# Patient Record
Sex: Male | Born: 1950 | Race: White | Hispanic: No | Marital: Married | Smoking: Former smoker
Health system: Southern US, Community
[De-identification: ages and names within clinical notes are randomized; demographics above are authoritative.]

## PROBLEM LIST (undated history)

## (undated) HISTORY — PX: COLONOSCOPY: SHX174

## (undated) HISTORY — PX: BACK SURGERY: SHX140

## (undated) HISTORY — PX: HAND SURGERY: SHX662

---

## 1999-03-12 ENCOUNTER — Encounter: Payer: Self-pay | Admitting: Neurosurgery

## 1999-03-17 ENCOUNTER — Encounter: Payer: Self-pay | Admitting: Neurosurgery

## 1999-03-17 ENCOUNTER — Inpatient Hospital Stay (HOSPITAL_COMMUNITY): Admission: RE | Admit: 1999-03-17 | Discharge: 1999-03-18 | Payer: Self-pay | Admitting: Neurosurgery

## 2003-07-31 ENCOUNTER — Ambulatory Visit (HOSPITAL_COMMUNITY): Admission: RE | Admit: 2003-07-31 | Discharge: 2003-07-31 | Payer: Self-pay | Admitting: *Deleted

## 2003-07-31 ENCOUNTER — Encounter: Payer: Self-pay | Admitting: *Deleted

## 2008-11-27 ENCOUNTER — Ambulatory Visit: Payer: Self-pay | Admitting: Family Medicine

## 2008-11-28 ENCOUNTER — Ambulatory Visit: Payer: Self-pay | Admitting: Family Medicine

## 2010-08-06 ENCOUNTER — Ambulatory Visit: Payer: Self-pay | Admitting: Family Medicine

## 2010-09-02 ENCOUNTER — Ambulatory Visit: Payer: Self-pay | Admitting: Family Medicine

## 2012-05-21 IMAGING — CR DG LUMBAR SPINE 2-3V
1 series · 3 of 3 positions shown · non-contrast
Comparison: none

REASON FOR EXAM: neck pain  back pain
COMMENTS:

[Series 2: view not recorded · 0.17mm/px · 3 of 3 slices shown]
[im 1/3]
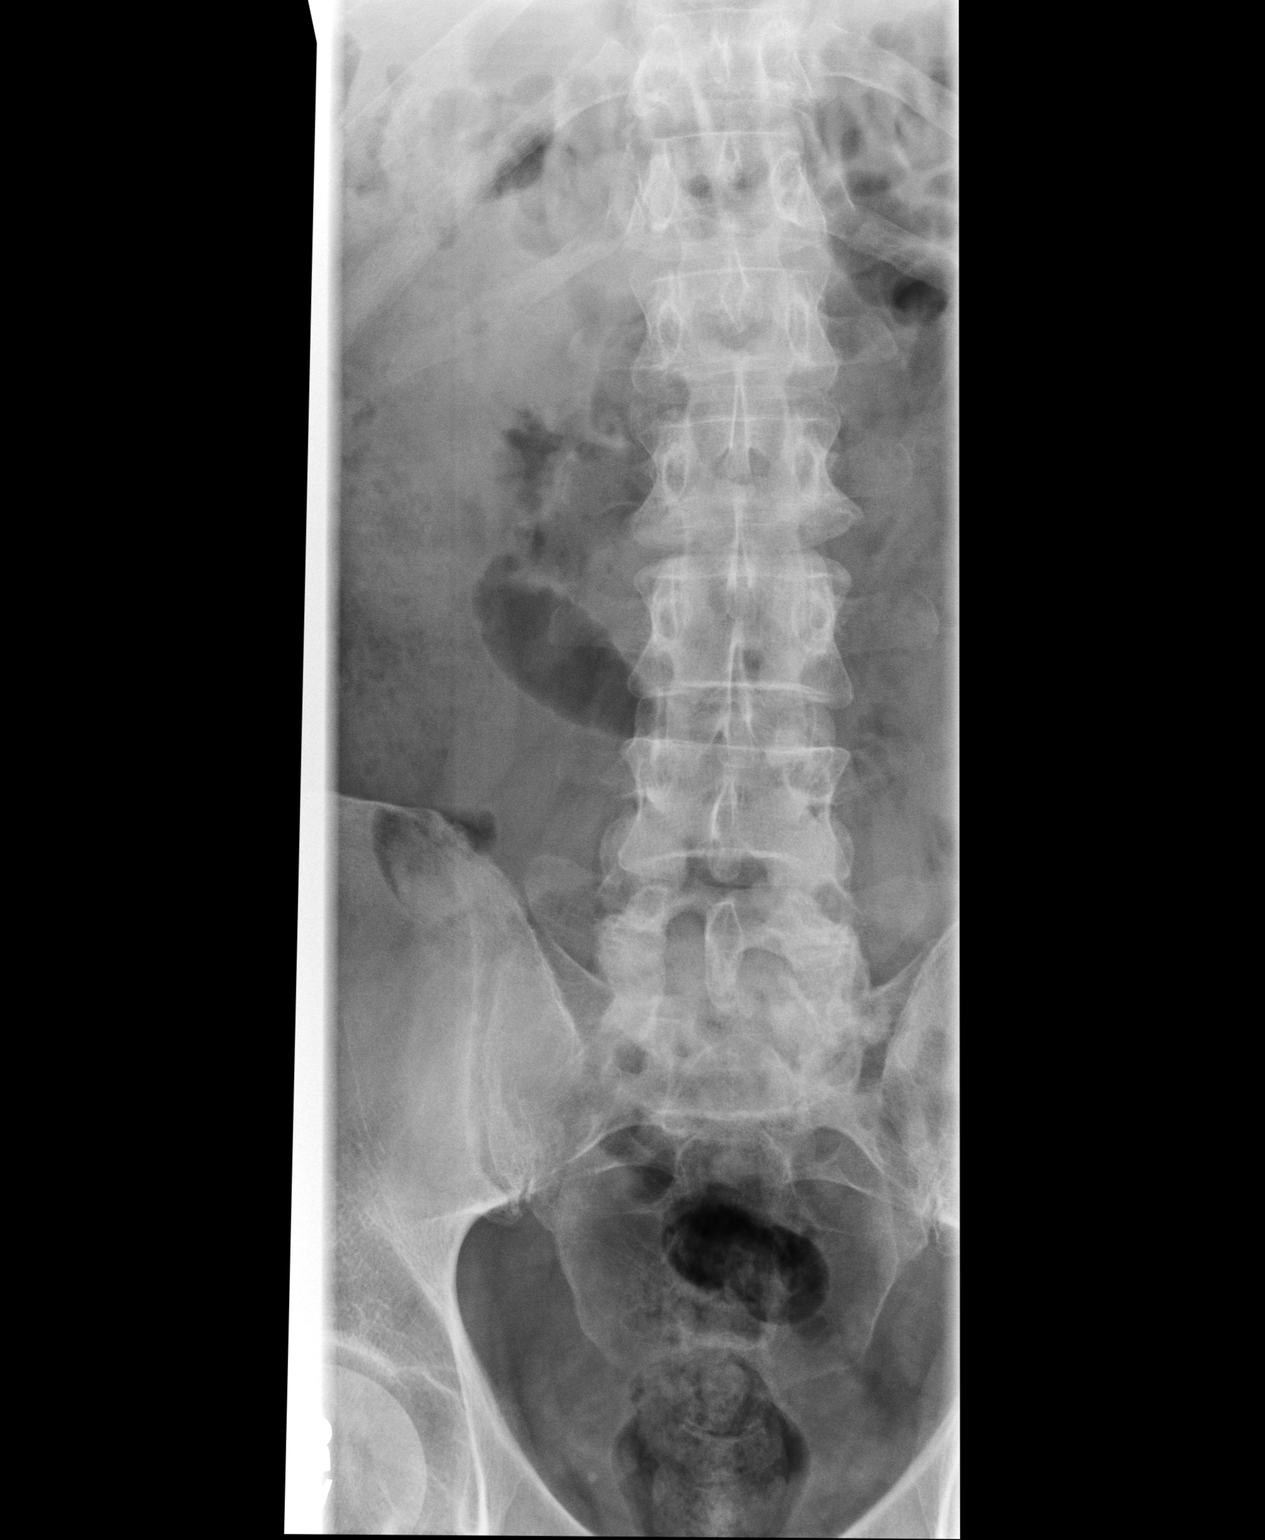
[im 2/3]
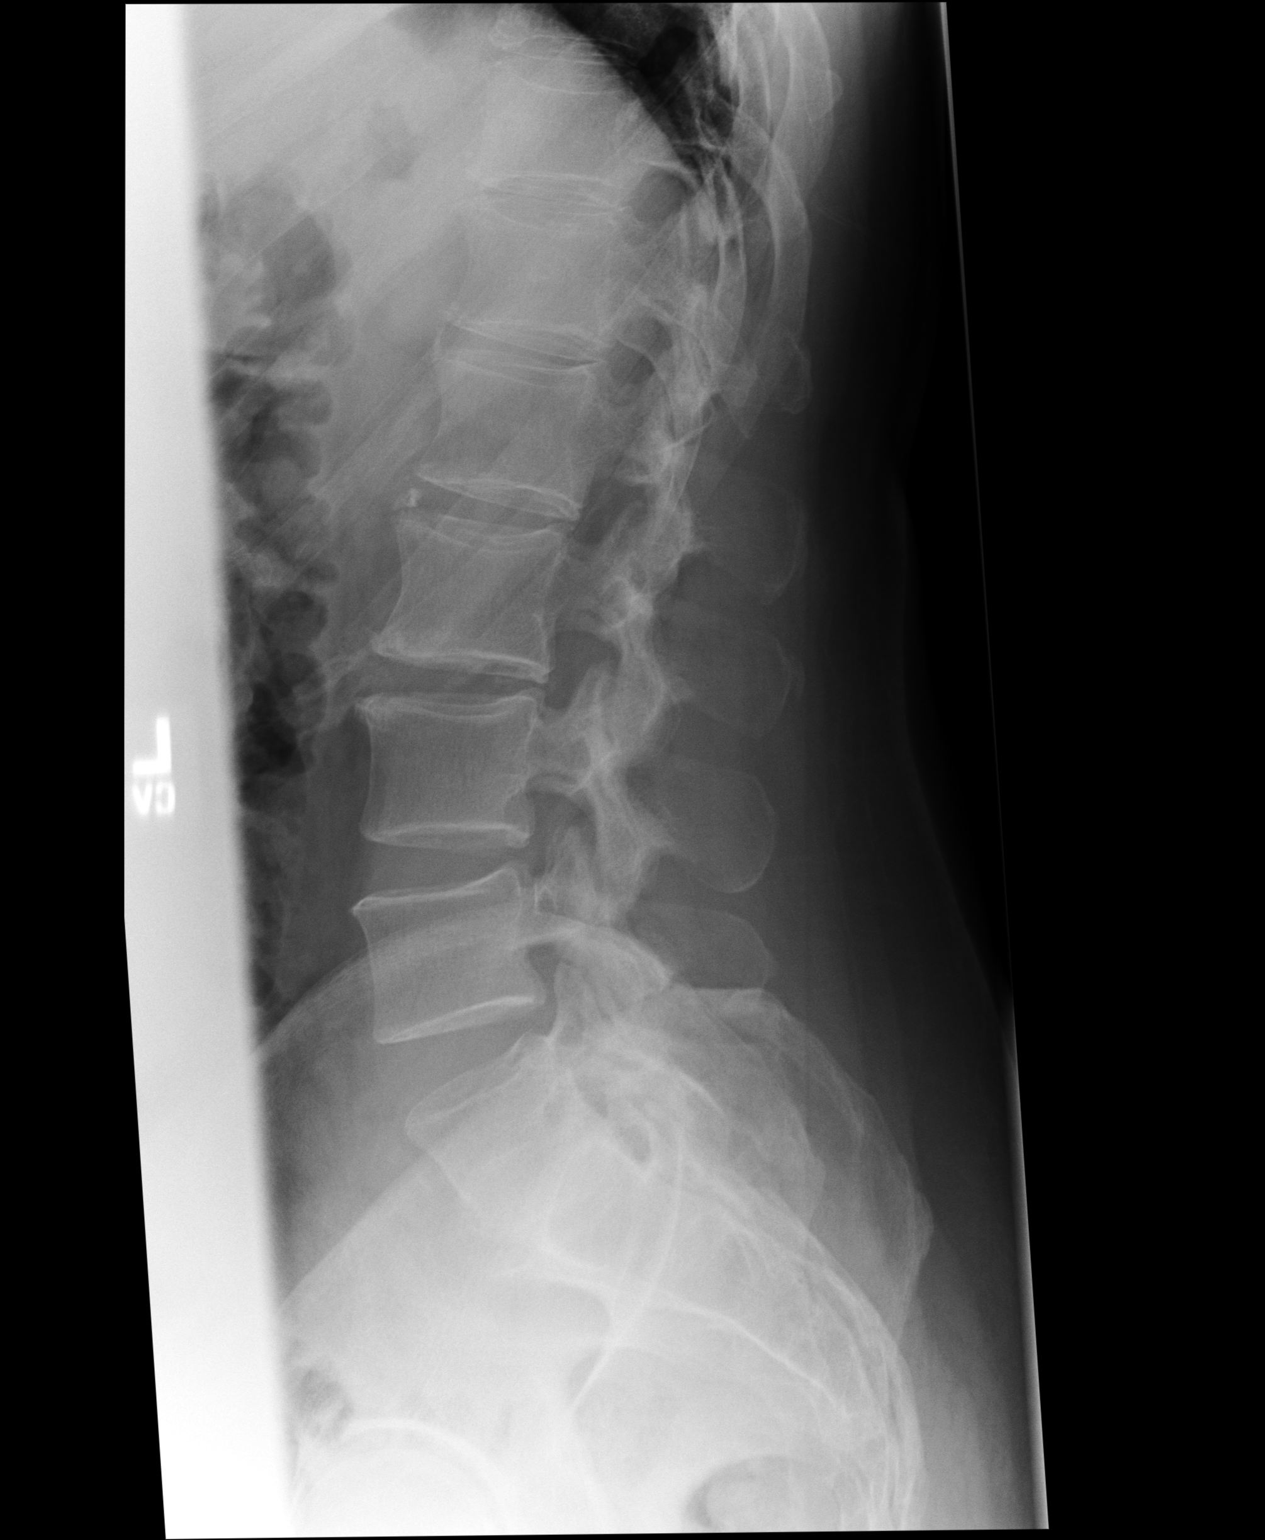
[im 3/3]
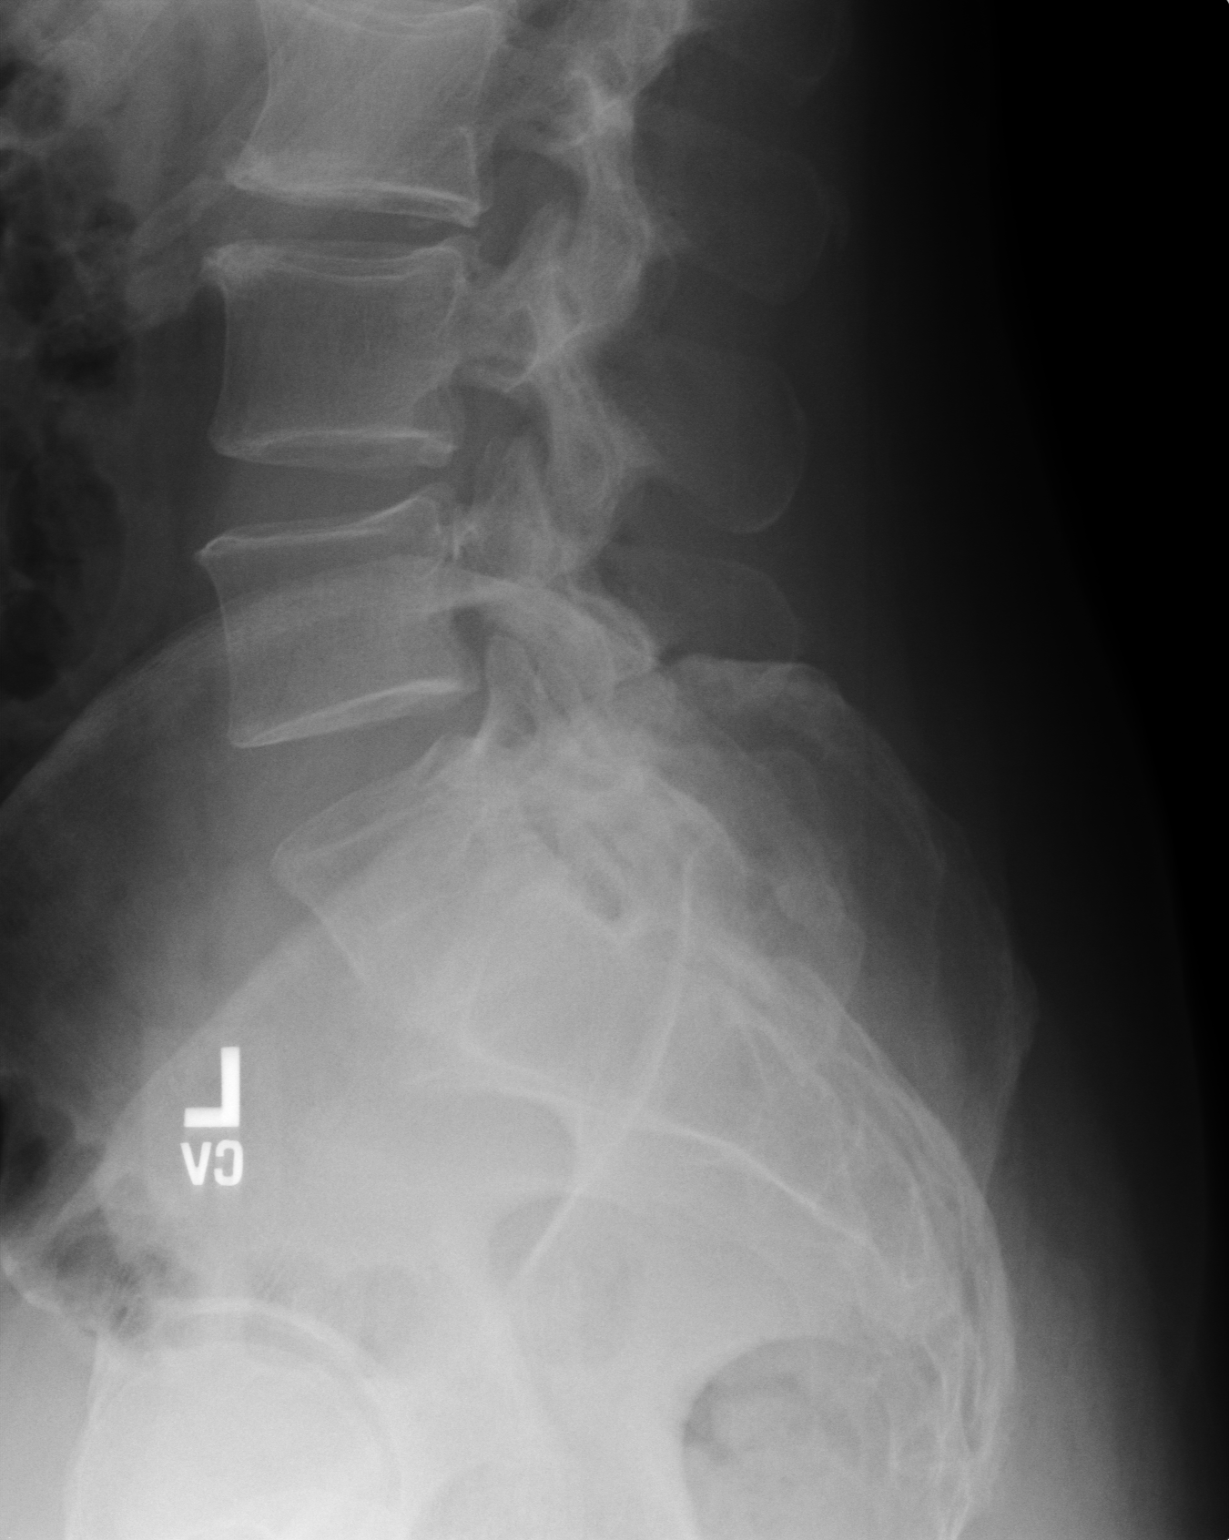

[3 of 3 positions shown; findings below may reference images not displayed]

PROCEDURE:     KDR - KDXR LUMBAR SPINE AP AND LATERAL  - August 06, 2010 [DATE]

RESULT:     The vertebral body heights are well-maintained. Vertebral body
alignment is normal. There is narrowing of the L5-S1 intervertebral disc
space which could either be developmental or secondary to disc disease. Note
is made that changes of disc disease with extrusion of disc material was
noted at this level on prior MR examination of 01/19/1999.

The intervertebral disc spaces otherwise are well maintained. The pedicles
are bilaterally intact.
IMPRESSION: 1. No fracture is seen.
2. There is narrowing with near obliteration of the L5-S1 intervertebral
disc space where changes of disc disease have been previously observed at MR.

## 2012-05-21 IMAGING — CR CERVICAL SPINE - 2-3 VIEW
1 series · 4 of 4 positions shown · non-contrast
Comparison: none

REASON FOR EXAM: neck and back pain
COMMENTS:

[Series 2: view not recorded · 0.17mm/px · 4 of 4 slices shown]
[im 1/4]
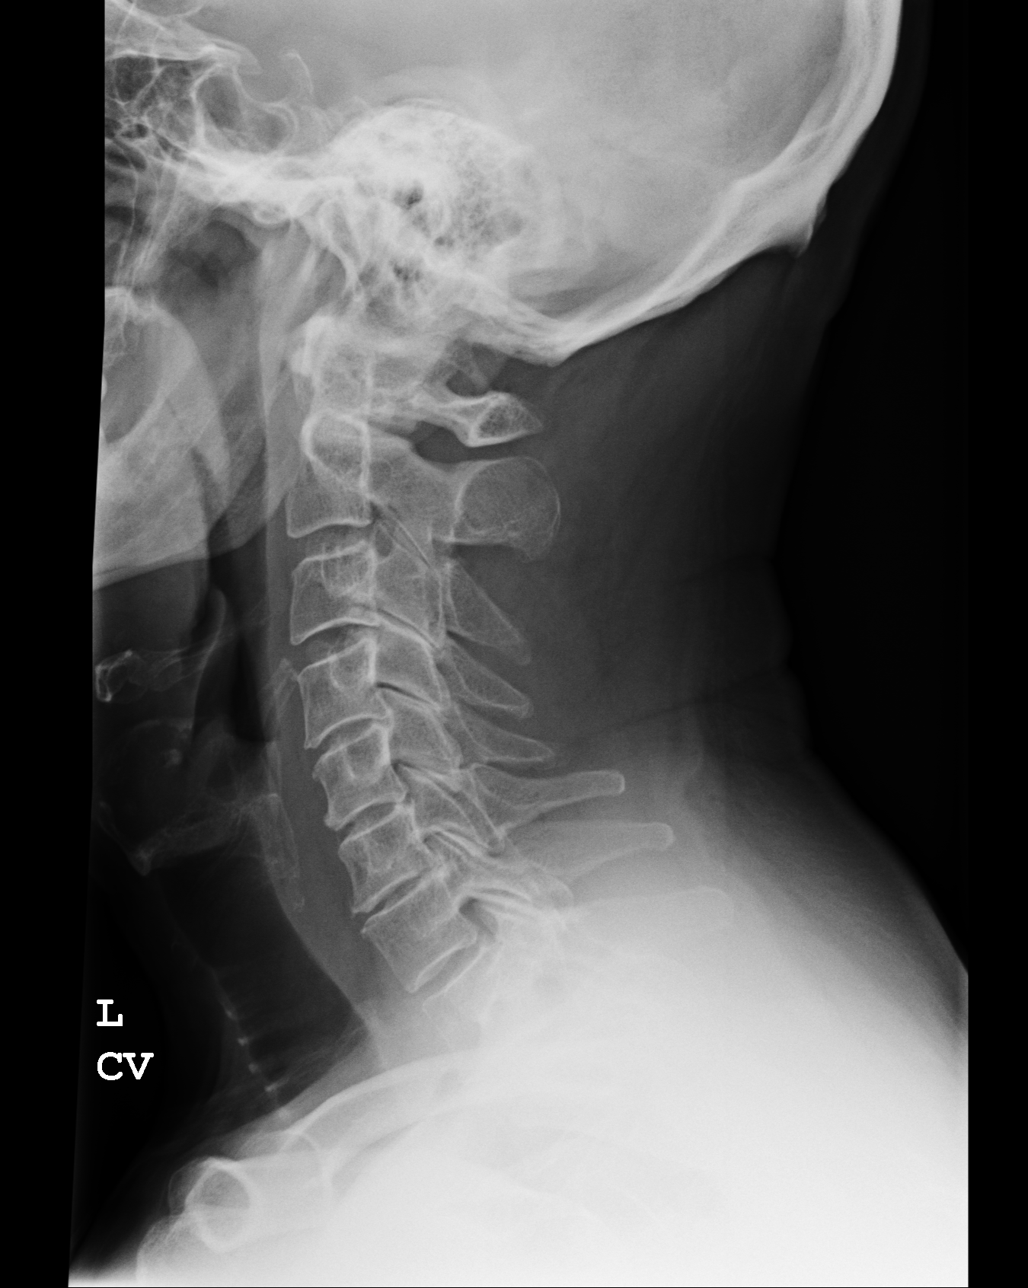
[im 2/4]
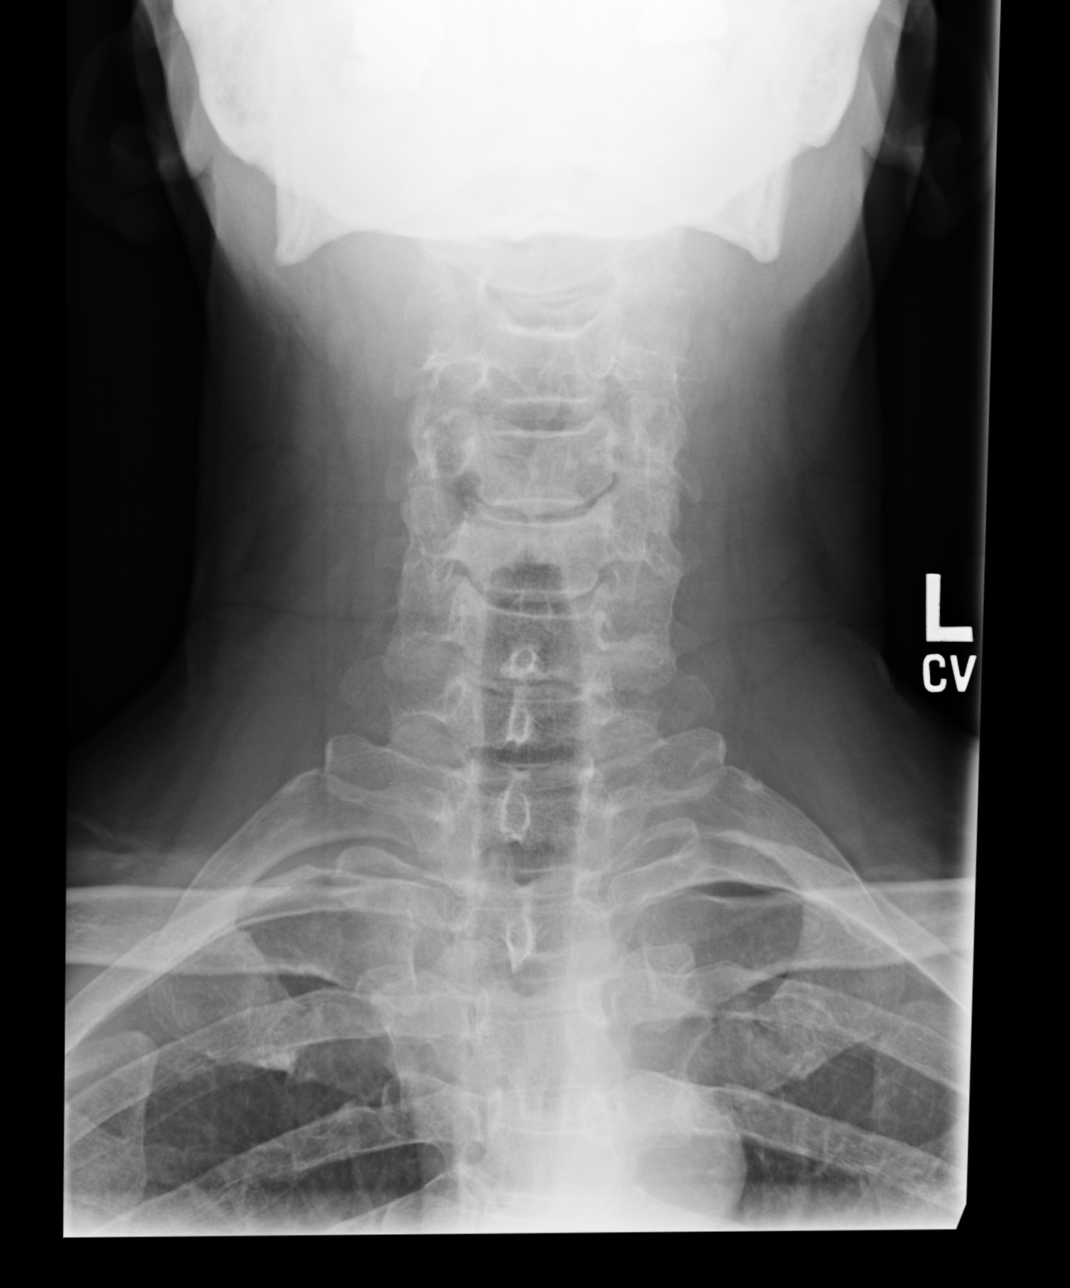
[im 3/4]
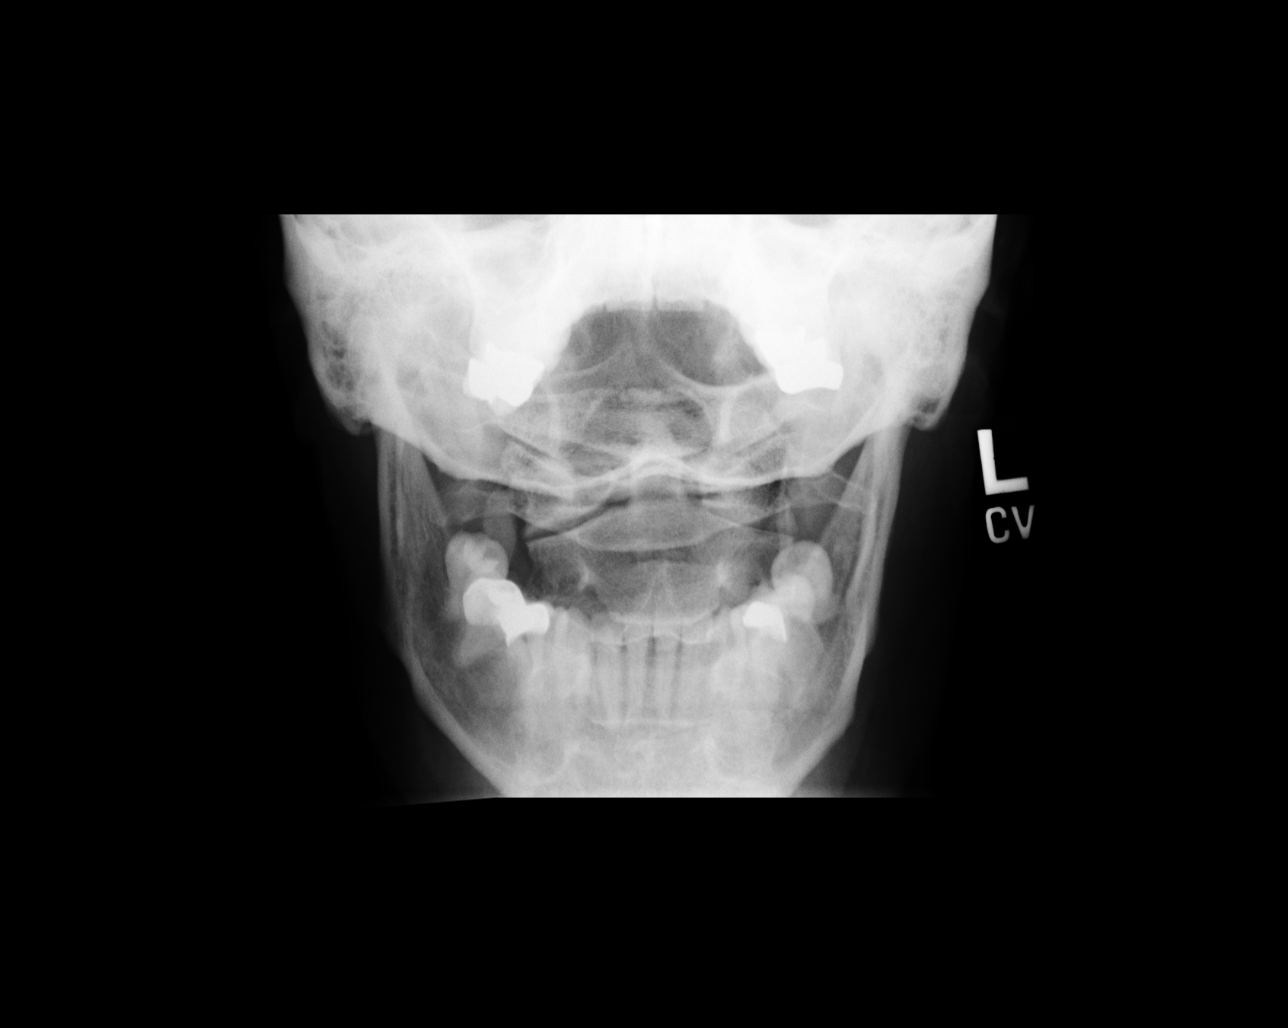
[im 4/4]
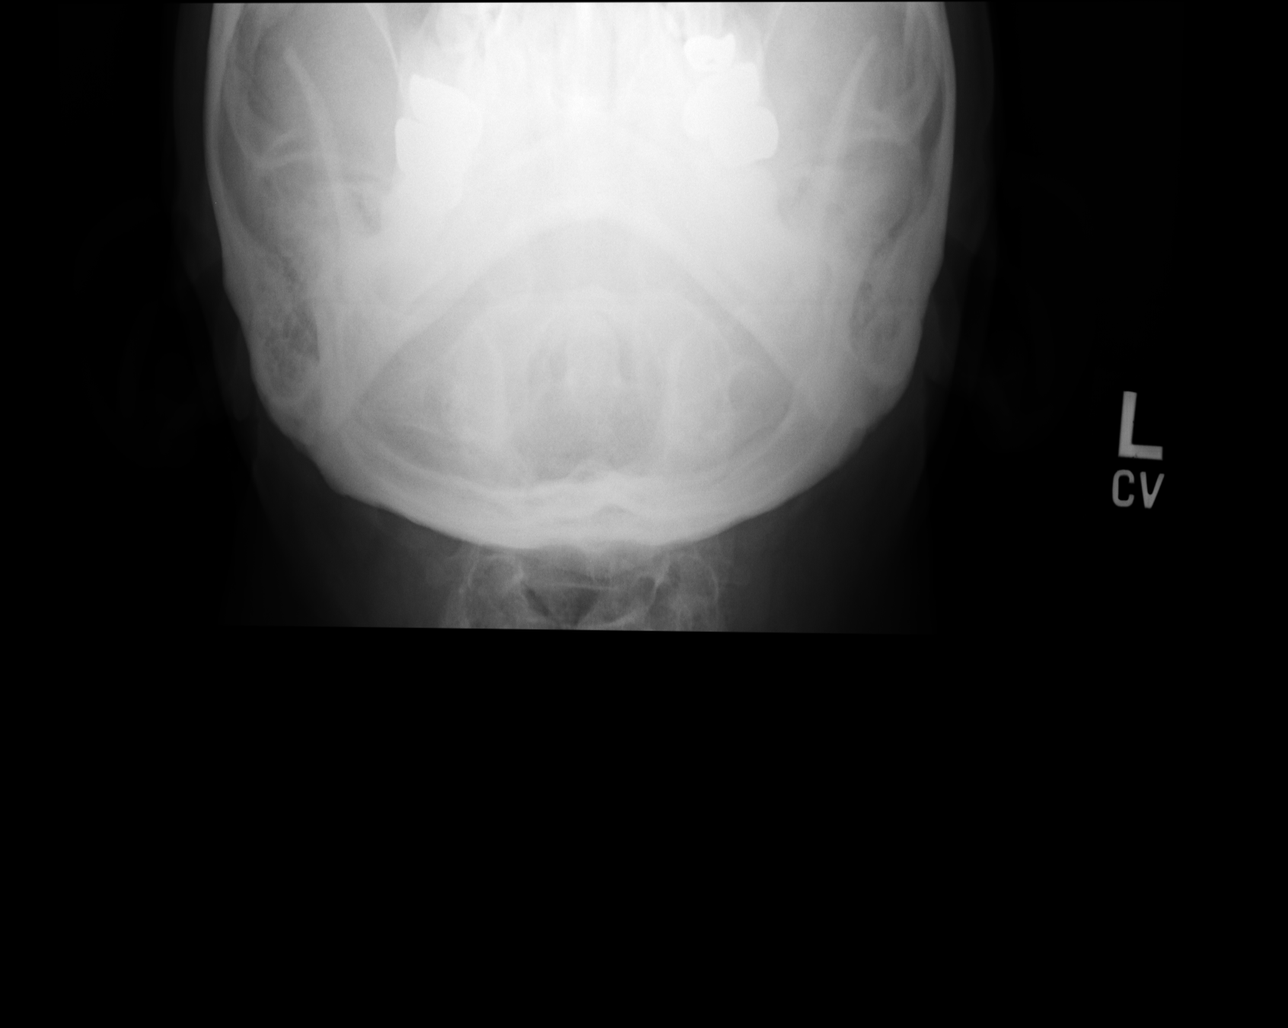

[4 of 4 positions shown; findings below may reference images not displayed]

PROCEDURE:     KDR - KDXR C-SPINE AP AND LATERAL  - August 06, 2010 [DATE]

RESULT:     Comparison is made to a prior exam of 11/27/2008. The vertebral
body heights are well-maintained. There is narrowing of the intervertebral
disc space at C6-C7. There is also noted slight narrowing of the
intervertebral disc spaces at C4-C5 and C5-C6. The possibility of disc
disease at these levels cannot be excluded. This could be further evaluated
by MR if clinically indicated. The odontoid process is intact. No lytic or
blastic lesions are seen.
IMPRESSION: 1. No fracture is seen.
2. There is disc space narrowing at multiple levels suspicious for disc
disease and most prominent at the C6-C7 level. This could be further
evaluated by MR if clinically indicated.

## 2013-12-05 LAB — HM COLONOSCOPY

## 2014-06-02 ENCOUNTER — Observation Stay: Payer: Self-pay | Admitting: Internal Medicine

## 2014-06-02 LAB — BASIC METABOLIC PANEL
Anion Gap: 8 (ref 7–16)
BUN: 19 mg/dL — ABNORMAL HIGH (ref 7–18)
Calcium, Total: 8.6 mg/dL (ref 8.5–10.1)
Chloride: 106 mmol/L (ref 98–107)
Co2: 26 mmol/L (ref 21–32)
Creatinine: 1.06 mg/dL (ref 0.60–1.30)
EGFR (African American): >60
EGFR (Non-African Amer.): >60
Glucose: 123 mg/dL — ABNORMAL HIGH (ref 65–99)
Osmolality: 283 (ref 275–301)
Potassium: 3.5 mmol/L (ref 3.5–5.1)
Sodium: 140 mmol/L (ref 136–145)

## 2014-06-02 LAB — CBC
HCT: 50.1 % (ref 40.0–52.0)
HGB: 16.6 g/dL (ref 13.0–18.0)
MCH: 31.9 pg (ref 26.0–34.0)
MCHC: 33 g/dL (ref 32.0–36.0)
MCV: 97 fL (ref 80–100)
Platelet: 262 10*3/uL (ref 150–440)
RBC: 5.18 10*6/uL (ref 4.40–5.90)
RDW: 14.1 % (ref 11.5–14.5)
WBC: 7.6 10*3/uL (ref 3.8–10.6)

## 2014-07-31 DIAGNOSIS — T782XXA Anaphylactic shock, unspecified, initial encounter: Secondary | ICD-10-CM | POA: Insufficient documentation

## 2015-03-22 NOTE — Discharge Summary (Signed)
PATIENT NAME:  [name], Ryan Castro  DATE OF ADMISSION:  Castro DATE OF DISCHARGE:  Castro  ADMISSION DIAGNOSIS: Anaphylaxis.   DISCHARGE DIAGNOSES:  1. Anaphylaxis, unclear etiology.  2. History of hyperlipidemia.   CONSULTATIONS: None.   HOSPITAL COURSE: A 64 year old male who presented with itching and angioedema. For further details, please refer to H and P.  1. Anaphylaxis. Unclear etiology as the patient had no new exposures, no new medications. He is taking aspirin daily, which sometimes can cause anaphylaxis, so we stopped this. He was on IV steroids and will be transitioned to p.o. steroids Benadryl and ranitidine.  He will need to see an allergist on Monday. This was discussed with the patient as well as his wife. He will be discharged with EpiPen.  2. Hyperlipidemia. The patient will continue Lipitor.   3. GERD. The patient is on a PPI.   DISCHARGE MEDICATIONS:  1. Prednisone taper starting at 50 mg taper by 10 mg every 2 days.  2. Benadryl 25 mg q. 4 hours p.r.n.  3. Ranitidine 150 b.i.d. x 5 days.  4. EpiPen. 5. Lipitor daily.  6. Reflux medication daily.   DISCHARGE DIET: Low fat, low sodium, low cholesterol diet.   DISCHARGE ACTIVITY: As tolerated.   DISCHARGE FOLLOWUP: Patient will follow up with Dr. Sullivan LoneGilbert in 1 week and the allergist tomorrow.   TIME SPENT: 35 minutes.   The patient is stable for discharge.    ____________________________ Maisen Schmit P. Juliene PinaMody, MD spm:dd D: Castro 11:06:50 ET T: Castro 19:12:30 ET JOB#: [id number]  cc: Dorthy Hustead P. Juliene PinaMody, MD, <Dictator> Richard L. Sullivan LoneGilbert, MD Janyth ContesSITAL P Saharsh Sterling MD ELECTRONICALLY SIGNED 06/10/2014 10:55

## 2015-03-22 NOTE — H&P (Signed)
PATIENT NAME:  Ryan Castro, Ryan Castro MR#:  [id number] DATE OF BIRTH:  [birth year]  DATE OF ADMISSION:  06/02/2014.  PRIMARY CARE PHYSICIAN:  Richard L. Sullivan LoneGilbert, MD.  REFERRING PHYSICIAN:  Dr. Dolores FrameSung.   CHIEF COMPLAINT: Anaphylactic reaction.   HISTORY OF PRESENT ILLNESS: The patient is a 64 year old Caucasian male with past medical history of GERD, hyperlipidemia and chronic low back pain who is presenting to the ED with chief complaint of allergic reaction. The patient woke up this morning at around 3:30 a.m. with itching and noticed his tongue was swollen. He felt nauseated and started noticing hives all over his body including legs, chest, abdomen and arms. This was associated with diaphoresis and dizziness. He denies passing out. He felt tight in his chest. The patient immediately came into the ED.  Initially his blood pressure was low as reported by the Emergency Room nurse. The patient was immediately given EpiPen subcutaneously, Benadryl, IV Solu-Medrol and ranitidine.  Subsequently, his blood pressure was back to normal, dizziness was significantly improved, hives were resolved but the patient was still complaining of the sensation of his throat closing and swollen tongue. He is having some difficulty with swallowing. He received another 10 mg of Decadron IV by the ED physician. Hospitalist team was called to admit the patient for close monitoring.   During my examination the patient is reporting that his chest and hives are resolved but he is still feeling uncomfortable and has a closing sensation in his throat. He is reporting that his tongue is still swollen though swelling is minimal. Wife is at bedside. No similar complaints in the past. He denies taking any unusual foods, new antibiotics or drugs. No similar complaints in the past. He not using any ACE inhibitors or angiotensin receptor blockers. No other complaints.   PAST MEDICAL HISTORY: Chronic low back pain, hyperlipidemia, GERD.   PAST  SURGICAL HISTORY: None.   ALLERGIES: No known drug allergies.   PSYCHOSOCIAL HISTORY: Lives at home with wife. Denies any history of smoking. Occasional intake of alcohol. Denies any illicit drug usage.   FAMILY HISTORY: Dad deceased with cancer,   HOME MEDICATIONS: Reflux medication, Lipitor dose unknown, aspirin 81 mg once daily.   REVIEW OF SYSTEMS:  CONSTITUTIONAL: Denies any fever or fatigue. No weight loss or weight gain.  EYES: Denies blurry vision, double or glaucoma.  ENT: Denies epistaxis or discharge or tinnitus.  RESPIRATORY: Denies any cough or COPD, complaining of shortness of breath, throat closing sensation, tongue swelling.  CARDIAC:  Denies any heart attacks in the past. No history of congestive heart failure. No peripheral edema.  GASTROINTESTINAL:  Complaining of nausea. Denies any vomiting. No diarrhea. Denies any abdominal pain. No hematemesis or melena.  NEUROLOGIC: Denies any vertigo, ataxia, but felt dizzy, no history of stroke or TIA.  SKIN: With hives all over his body, but that had resolved during my examination; no other rashes or lesions.  MUSCULOSKELETAL: No joint pain in the neck, has chronic low back pain, denies any gout.  ENDOCRINE: Denies any polyuria, nocturia, heat or cold intolerance.  PSYCHIATRIC: Normal mood and affect.   PHYSICAL EXAMINATION: VITAL SIGNS: Temperature 97.5, pulse 66, respirations 18, blood pressure is 130/88, pulse oximetry is 97%.  GENERAL APPEARANCE: Not in acute distress. Moderately built and nourished.  HEENT: Normocephalic, atraumatic. Pupils are equally reacting to light and accommodation. No scleral icterus. No conjunctival injection. No sinus tenderness. No postnasal drip. Moist mucous membranes. Thickening of tongue with angioedema, uvula is midline. No tonsillomegaly.  NECK: Supple. No JVD. No thyromegaly. Range of motion is intact.  LUNGS: Clear to auscultation bilaterally. No accessory muscle use and no wheezing. No  anterior chest wall tenderness on palpation.  CARDIAC: S1 and S2 normal. Regular rate and rhythm. No murmurs.  GASTROINTESTINAL: Soft. Bowel sounds are positive in all 4 quadrants. Nontender, nondistended. No hepatosplenomegaly. No masses.  NEUROLOGICAL:  Alert and oriented x3. Cranial nerves II-XII are grossly intact. Motor and sensory are intact. Reflexes are 2+.   EXTREMITIES: No edema or cyanosis. No clubbing.  SKIN: Warm to touch. Normal turgor. No rashes. No lesions.  MUSCULOSKELETAL: No joint effusion, tenderness, erythema.  PSYCHIATRIC: Normal mood and affect.    LABORATORY AND IMAGING STUDIES: A 12-lead EKG, normal sinus rhythm at 67 beats per minute. Nonspecific ST-T wave changes.   CBC is normal. Chem-8: BUN 19, glucose 123. The rest of the labs are normal.   ASSESSMENT: A 64 year old Caucasian male presenting to the ED with a chief complaint of hives, chest tightness, throat closing, angioedema associated with itching and dizziness, associated with diaphoresis.   PLAN:  The patient will be admitted with the following assessment and plan:  1.  Anaphylactic reaction with unknown source. The patient has received subcutaneous EpiPen, Solu-Medrol IV, Benadryl IV and ranitidine in the Emergency Department. We will continue Solu-Medrol IV, nebulizer treatments and Benadryl as needed. We will continue close monitoring of the patient, source is unclear at this time.  2.  Gastroesophageal reflux disease. We will provide pepcid 3.  Chronic low back pain. Pain management on an as-needed basis.  4.  History of hyperlipidemia, dose of Lipitor is unknown.  Will renew medications after verification of the dosing.  5.  Dizziness, probably from problem number 1, which is resolved now. We will continue close monitoring on telemetry.   Plan of care discussed with the patient.   CODE STATUS:  Full code.   The patient's wife is the medical power of attorney.   Total Time Spent on admission: 45  minutes.    ____________________________ Ramonita Lab, MD ag:lt D: 06/02/2014 07:41:08 ET T: 06/02/2014 08:11:25 ET JOB#: [id number]  cc: Ramonita Lab, MD, <Dictator> Richard L. Sullivan Lone, MD Ramonita Lab MD ELECTRONICALLY SIGNED 06/04/2014 0:38

## 2015-03-25 LAB — CBC AND DIFFERENTIAL
HCT: 46 % (ref 41–53)
Hemoglobin: 16.1 g/dL (ref 13.5–17.5)
NEUTROS ABS: 2 /uL
Platelets: 234 10*3/uL (ref 150–399)
WBC: 4.7 10^3/mL

## 2015-03-25 LAB — BASIC METABOLIC PANEL
BUN: 20 mg/dL (ref 4–21)
Creatinine: 0.9 mg/dL (ref 0.6–1.3)
GLUCOSE: 84 mg/dL
POTASSIUM: 4.9 mmol/L (ref 3.4–5.3)
SODIUM: 142 mmol/L (ref 137–147)

## 2015-03-25 LAB — HEPATIC FUNCTION PANEL
ALT: 31 U/L (ref 10–40)
AST: 33 U/L (ref 14–40)
Alkaline Phosphatase: 61 U/L (ref 25–125)
Bilirubin, Total: 0.7 mg/dL

## 2015-03-25 LAB — LIPID PANEL
Cholesterol: 176 mg/dL (ref 0–200)
HDL: 76 mg/dL — AB (ref 35–70)
LDL Cholesterol: 81 mg/dL
LDl/HDL Ratio: 1.1
Triglycerides: 97 mg/dL (ref 40–160)

## 2015-03-25 LAB — TSH: TSH: 2.81 u[IU]/mL (ref 0.41–5.90)

## 2015-03-25 LAB — PSA: PSA: 0.5

## 2015-08-26 LAB — HEPATIC FUNCTION PANEL
ALK PHOS: 59 U/L (ref 25–125)
ALT: 30 U/L (ref 10–40)
AST: 32 U/L (ref 14–40)
Bilirubin, Total: 0.8 mg/dL

## 2015-08-26 LAB — CBC AND DIFFERENTIAL
HEMATOCRIT: 49 % (ref 41–53)
HEMOGLOBIN: 16.3 g/dL (ref 13.5–17.5)
Platelets: 251 10*3/uL (ref 150–399)
WBC: 4.8 10*3/mL

## 2015-08-26 LAB — LIPID PANEL
CHOLESTEROL: 164 mg/dL (ref 0–200)
HDL: 61 mg/dL (ref 35–70)
LDL Cholesterol: 78 mg/dL
LDL/HDL RATIO: 2.7
TRIGLYCERIDES: 123 mg/dL (ref 40–160)

## 2015-08-26 LAB — BASIC METABOLIC PANEL
BUN: 19 mg/dL (ref 4–21)
Creatinine: 1 mg/dL (ref 0.6–1.3)
Glucose: 73 mg/dL
POTASSIUM: 4.8 mmol/L (ref 3.4–5.3)
Sodium: 142 mmol/L (ref 137–147)

## 2015-08-26 LAB — PSA: PSA: 0.5

## 2015-11-21 ENCOUNTER — Encounter: Payer: Self-pay | Admitting: Family Medicine

## 2015-12-18 DIAGNOSIS — M5414 Radiculopathy, thoracic region: Secondary | ICD-10-CM | POA: Insufficient documentation

## 2015-12-18 DIAGNOSIS — M5412 Radiculopathy, cervical region: Secondary | ICD-10-CM | POA: Insufficient documentation

## 2015-12-18 DIAGNOSIS — M720 Palmar fascial fibromatosis [Dupuytren]: Secondary | ICD-10-CM | POA: Insufficient documentation

## 2015-12-18 DIAGNOSIS — E7800 Pure hypercholesterolemia, unspecified: Secondary | ICD-10-CM | POA: Insufficient documentation

## 2015-12-18 DIAGNOSIS — K2100 Gastro-esophageal reflux disease with esophagitis, without bleeding: Secondary | ICD-10-CM | POA: Insufficient documentation

## 2015-12-18 DIAGNOSIS — E663 Overweight: Secondary | ICD-10-CM | POA: Insufficient documentation

## 2015-12-18 DIAGNOSIS — F419 Anxiety disorder, unspecified: Secondary | ICD-10-CM | POA: Insufficient documentation

## 2015-12-18 DIAGNOSIS — M109 Gout, unspecified: Secondary | ICD-10-CM | POA: Insufficient documentation

## 2016-03-15 ENCOUNTER — Encounter: Payer: Self-pay | Admitting: Family Medicine

## 2016-03-15 ENCOUNTER — Ambulatory Visit (INDEPENDENT_AMBULATORY_CARE_PROVIDER_SITE_OTHER): Payer: BLUE CROSS/BLUE SHIELD | Admitting: Family Medicine

## 2016-03-15 VITALS — BP 116/84 | HR 78 | Temp 98.0°F | Resp 14 | Ht 68.75 in | Wt 202.0 lb

## 2016-03-15 DIAGNOSIS — R202 Paresthesia of skin: Secondary | ICD-10-CM

## 2016-03-15 DIAGNOSIS — Z1211 Encounter for screening for malignant neoplasm of colon: Secondary | ICD-10-CM

## 2016-03-15 DIAGNOSIS — Z0000 Encounter for general adult medical examination without abnormal findings: Secondary | ICD-10-CM

## 2016-03-15 DIAGNOSIS — R200 Anesthesia of skin: Secondary | ICD-10-CM | POA: Diagnosis not present

## 2016-03-15 DIAGNOSIS — Z125 Encounter for screening for malignant neoplasm of prostate: Secondary | ICD-10-CM

## 2016-03-15 LAB — IFOBT (OCCULT BLOOD): IMMUNOLOGICAL FECAL OCCULT BLOOD TEST: NEGATIVE

## 2016-03-15 NOTE — Progress Notes (Deleted)
Patient ID: Ryan RossettiStephen Castro, male   DOB: [birth year], 65 y.o.   MRN: [id number]  Visit Date: 03/15/2016  Today's Provider: Megan Mansichard Gilbert Jr, MD   Chief Complaint  Patient presents with  . Annual Exam   Subjective:  Ryan RossettiStephen Castro is a 65 y.o. male who presents today for health maintenance and complete physical. He feels {DESC; WELL/FAIRLY WELL/POORLY:18703}. He reports exercising ***. He reports he is sleeping {DESC; WELL/FAIRLY WELL/POORLY:18703}. Immunization History  Administered Date(s) Administered  . Tdap 02/21/2008  . Zoster 11/22/2011   Last  Colonoscopy 11/04/03 repeat in 10 years-diverticulosis, internal hemorrhoids, gastritis.  Review of Systems  Social History   Social History  . Marital Status: Married    Spouse Name: N/A  . Number of Children: N/A  . Years of Education: N/A   Occupational History  . Not on file.   Social History Main Topics  . Smoking status: Not on file  . Smokeless tobacco: Not on file  . Alcohol Use: Not on file  . Drug Use: Not on file  . Sexual Activity: Not on file   Other Topics Concern  . Not on file   Social History Narrative  . No narrative on file    Patient Active Problem List   Diagnosis Date Noted  . Anxiety 12/18/2015  . 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 Esophagitis, reflux 12/18/2015  . Gout 12/18/2015  . Hypercholesterolemia 12/18/2015  . Overweight 12/18/2015  . 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 Back surgery      due to ruptured disk at L4 and L5 level  . Hand surgery      left, growth on tendon    His family history includes Breast cancer in his mother; Diabetes in his brother and sister; Fibromyalgia in his sister; Gout in his father; Lung cancer in his father; Renal cancer in his father; Thyroid cancer in his father.    Outpatient Prescriptions Prior to Visit  Medication Sig Dispense Refill  . aspirin 81 MG tablet  Take by mouth.    Marland Kitchen. atorvastatin (LIPITOR) 20 MG tablet Take by mouth.    Marland Kitchen. omeprazole (PRILOSEC) 40 MG capsule Take by mouth.    . Phosphatidylserine-DHA-EPA (VAYACOG) 100-19.5-6.5 MG CAPS Take by mouth.    . Phosphatidylserine-DHA-EPA (VAYARIN) 75-21.5-8.5 MG CAPS Take by mouth.    . vitamin C (ASCORBIC ACID) 500 MG tablet Take by mouth.     No facility-administered medications prior to visit.    Patient Care Team: Maple Hudsonichard L Gilbert Jr., MD as PCP - General (Family Medicine)     Objective:   Vitals: There were no vitals filed for this visit.  Physical Exam   Depression Screen No flowsheet data found.    Assessment & Plan:

## 2016-03-15 NOTE — Progress Notes (Signed)
Patient ID: Ryan Castro, male   DOB: 1951-06-03, 65 y.o.   MRN: [id number]  Visit Date: 03/15/2016  Today's Provider: Megan Mansichard Shadaya Marschner Jr, MD   Chief Complaint  Patient presents with  . Medicare Wellness   Subjective:   Ryan Castro is a 65 y.o. male who presents today for his CPE  He feels well. He reports exercising daily-walking. He reports he is sleeping well. Immunization History  Administered Date(s) Administered  . Tdap 02/21/2008  . Zoster 11/22/2011   Last  Colonoscopy 11/04/03 repeat in 10 years-diverticulosis, internal hemorrhoids, gastritis. Patient states he had another colonoscopy in 2014 around there with Dr. Cherene AltesElliott-will request those records.  Review of Systems  Constitutional: Negative.   HENT: Negative.   Eyes: Negative.   Respiratory: Negative.   Cardiovascular: Negative.   Gastrointestinal: Negative.   Endocrine: Negative.   Genitourinary: Negative.   Musculoskeletal: Negative.   Skin: Negative.   Allergic/Immunologic: Negative.   Neurological: Negative.   Hematological: Negative.   Psychiatric/Behavioral: Negative.     Patient Active Problem List   Diagnosis Date Noted  . Anxiety 12/18/2015  . 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 Esophagitis, reflux 12/18/2015  . Gout 12/18/2015  . Hypercholesterolemia 12/18/2015  . Overweight 12/18/2015  . 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 Allergic shock 07/31/2014    Social History   Social History  . Marital Status: Married    Spouse Name: N/A  . Number of Children: N/A  . Years of Education: N/A   Occupational History  . Not on file.   Social History Main Topics  . Smoking status: Former Smoker -- 0.50 packs/day for 7 years    Types: Cigarettes    Quit date: 11/29/1976  . Smokeless tobacco: Never Used  . Alcohol Use: Yes     Comment: 2 drinks daily  . Drug Use: No  . Sexual Activity: Not on file   Other Topics Concern  . Not on file   Social History  Narrative    Past Surgical History  Procedure Laterality Date  . Back surgery      due to ruptured disk at L4 and L5 level  . Hand surgery      left, growth on tendon    His family history includes Breast cancer in his mother; Diabetes in his brother and sister; Fibromyalgia in his sister; Gout in his father; Lung cancer in his father; Renal cancer in his father; Thyroid cancer in his father.    Outpatient Prescriptions Prior to Visit  Medication Sig Dispense Refill  . aspirin 81 MG tablet Take by mouth.    Marland Kitchen. atorvastatin (LIPITOR) 20 MG tablet Take 20 mg by mouth. Takes 1/2 tablet once daily    . vitamin C (ASCORBIC ACID) 500 MG tablet Take by mouth.    Marland Kitchen. omeprazole (PRILOSEC) 40 MG capsule Take by mouth.    . Phosphatidylserine-DHA-EPA (VAYACOG) 100-19.5-6.5 MG CAPS Take by mouth.    . Phosphatidylserine-DHA-EPA (VAYARIN) 75-21.5-8.5 MG CAPS Take by mouth.     No facility-administered medications prior to visit.    No Known Allergies  Patient Care Team: Maple Hudsonichard L Bunnie Lederman Jr., MD as PCP - General (Family Medicine)  Objective:   Vitals:  Filed Vitals:   03/15/16 0910  BP: 116/84  Pulse: 78  Temp: 98 F (36.7 C)  Resp: 14  Height: 5' 8.75" (1.746 m)  Weight: 202 lb (91.627 kg)    Physical Exam  Constitutional: He is oriented to person,  place, and time. He appears well-developed and well-nourished.  HENT:  Head: Normocephalic and atraumatic.  Right Ear: External ear normal.  Left Ear: External ear normal.  Mouth/Throat: Oropharynx is clear and moist.  Eyes: Conjunctivae are normal. Pupils are equal, round, and reactive to light.  Neck: Normal range of motion. Neck supple.  Cardiovascular: Normal rate, regular rhythm, normal heart sounds and intact distal pulses.   No murmur heard. Pulmonary/Chest: Effort normal and breath sounds normal. No respiratory distress. He has no wheezes.  Abdominal: Soft. He exhibits no distension. There is no tenderness.   Genitourinary: Rectum normal, prostate normal and penis normal. Guaiac negative stool. No penile tenderness.  Musculoskeletal: He exhibits no edema or tenderness.  Neurological: He is alert and oriented to person, place, and time. He displays normal reflexes. No cranial nerve deficit. He exhibits normal muscle tone. Coordination normal.  Skin: Skin is warm and dry. No erythema.  Psychiatric: He has a normal mood and affect. His behavior is normal. Judgment and thought content normal.    Activities of Daily Living In your present state of health, do you have any difficulty performing the following activities: 03/15/2016  Hearing? N  Vision? N  Difficulty concentrating or making decisions? N  Walking or climbing stairs? N  Dressing or bathing? N  Doing errands, shopping? N    Fall Risk Assessment Fall Risk  03/15/2016  Falls in the past year? Yes  Number falls in past yr: 1  Injury with Fall? Yes     Depression Screen PHQ 2/9 Scores 03/15/2016  PHQ - 2 Score 0    Cognitive Testing - 6-CIT    Year: 0 4 points  Month: 0 3 points  Memorize "Floyde Parkins, [address], [address] [place]"  Time (within 1 hour:) 0 3 points  Count backwards from 20: 0 2 4 points  Name months of year: 0 2 4 points  Repeat Address: 0 points   Total Score: 6/28  Interpretation : Normal (0-7) Abnormal (8-28)    Assessment & Plan:     Annual Wellness Visit/CPE  Reviewed patient's Family Medical History Reviewed and updated list of patient's medical providers Assessment of cognitive impairment was done Assessed patient's functional ability Established a written schedule for health screening services Health Risk Assessent Completed and Reviewed  EKG stable. Vision is ok per our chart.  Per receptionist patient does not have medicare for primary just for hospital. - EKG 12-Lead  3. Colon cancer screening - IFOBT POC (occult bld, rslt in office)  4. Numbness and tingling in left arm -  Ambulatory referral to Neurology  5. Numbness and tingling of left side of face - Ambulatory referral to Neurology  Patient was seen and examined by Dr. Bosie Clos and note was scribed by Samara Deist, RMA.  I have done the exam and reviewed the above chart and it is accurate to the best of my knowledge.  Julieanne Manson MD [facility] 03/15/2016 9:15 AM  ------------------------------------------------------------------------------------------------------------

## 2016-03-16 ENCOUNTER — Encounter: Payer: Self-pay | Admitting: Family Medicine

## 2016-03-18 ENCOUNTER — Telehealth: Payer: Self-pay

## 2016-03-18 LAB — CBC WITH DIFFERENTIAL/PLATELET
Basophils Absolute: 0 10*3/uL (ref 0.0–0.2)
Basos: 1 %
EOS (ABSOLUTE): 0.2 10*3/uL (ref 0.0–0.4)
EOS: 4 %
Hematocrit: 46.4 % (ref 37.5–51.0)
Hemoglobin: 15.9 g/dL (ref 12.6–17.7)
IMMATURE GRANULOCYTES: 0 %
Immature Grans (Abs): 0 10*3/uL (ref 0.0–0.1)
Lymphocytes Absolute: 1.7 10*3/uL (ref 0.7–3.1)
Lymphs: 36 %
MCH: 32.9 pg (ref 26.6–33.0)
MCHC: 34.3 g/dL (ref 31.5–35.7)
MCV: 96 fL (ref 79–97)
MONOS ABS: 0.6 10*3/uL (ref 0.1–0.9)
Monocytes: 12 %
NEUTROS PCT: 47 %
Neutrophils Absolute: 2.3 10*3/uL (ref 1.4–7.0)
PLATELETS: 308 10*3/uL (ref 150–379)
RBC: 4.84 x10E6/uL (ref 4.14–5.80)
RDW: 13.8 % (ref 12.3–15.4)
WBC: 4.9 10*3/uL (ref 3.4–10.8)

## 2016-03-18 LAB — COMPREHENSIVE METABOLIC PANEL
ALT: 36 IU/L (ref 0–44)
AST: 29 IU/L (ref 0–40)
Albumin/Globulin Ratio: 2 (ref 1.2–2.2)
Albumin: 4.5 g/dL (ref 3.6–4.8)
Alkaline Phosphatase: 69 IU/L (ref 39–117)
BUN/Creatinine Ratio: 17 (ref 10–24)
BUN: 15 mg/dL (ref 8–27)
Bilirubin Total: 0.8 mg/dL (ref 0.0–1.2)
CALCIUM: 9.8 mg/dL (ref 8.6–10.2)
CO2: 23 mmol/L (ref 18–29)
CREATININE: 0.87 mg/dL (ref 0.76–1.27)
Chloride: 102 mmol/L (ref 96–106)
GFR calc Af Amer: 105 mL/min/{1.73_m2} (ref >59)
GFR, EST NON AFRICAN AMERICAN: 91 mL/min/{1.73_m2} (ref >59)
GLUCOSE: 90 mg/dL (ref 65–99)
Globulin, Total: 2.3 g/dL (ref 1.5–4.5)
POTASSIUM: 4.9 mmol/L (ref 3.5–5.2)
Sodium: 143 mmol/L (ref 134–144)
Total Protein: 6.8 g/dL (ref 6.0–8.5)

## 2016-03-18 LAB — LIPID PANEL WITH LDL/HDL RATIO
Cholesterol, Total: 172 mg/dL (ref 100–199)
HDL: 65 mg/dL (ref >39)
LDL CALC: 75 mg/dL (ref 0–99)
LDL/HDL RATIO: 1.2 ratio (ref 0.0–3.6)
TRIGLYCERIDES: 159 mg/dL — AB (ref 0–149)
VLDL Cholesterol Cal: 32 mg/dL (ref 5–40)

## 2016-03-18 LAB — PSA: Prostate Specific Ag, Serum: 0.6 ng/mL (ref 0.0–4.0)

## 2016-03-18 LAB — TSH: TSH: 3.52 u[IU]/mL (ref 0.450–4.500)

## 2016-03-18 NOTE — Telephone Encounter (Signed)
LMT[facility] ED 

## 2016-03-18 NOTE — Telephone Encounter (Signed)
-----   Message from Maple Hudsonichard L Gilbert Jr., MD sent at 03/18/2016  8:08 AM EDT ----- Labs stable.

## 2016-03-18 NOTE — Telephone Encounter (Signed)
Pt advised.   Thanks,   -Satsuki Zillmer  

## 2016-04-13 ENCOUNTER — Ambulatory Visit (INDEPENDENT_AMBULATORY_CARE_PROVIDER_SITE_OTHER): Payer: BLUE CROSS/BLUE SHIELD | Admitting: Family Medicine

## 2016-04-13 VITALS — Temp 98.3°F

## 2016-04-13 DIAGNOSIS — Z23 Encounter for immunization: Secondary | ICD-10-CM | POA: Diagnosis not present

## 2016-04-19 ENCOUNTER — Other Ambulatory Visit: Payer: Self-pay | Admitting: Neurology

## 2016-04-19 DIAGNOSIS — R200 Anesthesia of skin: Secondary | ICD-10-CM

## 2016-05-11 ENCOUNTER — Ambulatory Visit: Payer: BLUE CROSS/BLUE SHIELD

## 2016-08-25 LAB — LIPID PANEL
CHOLESTEROL: 210 mg/dL — AB (ref 0–200)
HDL: 74 mg/dL — AB (ref 35–70)
LDL CALC: 128 mg/dL
LDL/HDL RATIO: 2.8
TRIGLYCERIDES: 40 mg/dL (ref 40–160)

## 2016-08-25 LAB — CBC AND DIFFERENTIAL
HCT: 47 % (ref 41–53)
HEMOGLOBIN: 15.7 g/dL (ref 13.5–17.5)
PLATELETS: 227 10*3/uL (ref 150–399)
WBC: 5.6 10*3/mL

## 2016-08-25 LAB — BASIC METABOLIC PANEL
BUN: 16 mg/dL (ref 4–21)
Creatinine: 0.9 mg/dL (ref 0.6–1.3)
Glucose: 95 mg/dL
Potassium: 5 mmol/L (ref 3.4–5.3)
Sodium: 141 mmol/L (ref 137–147)

## 2016-08-25 LAB — HEPATIC FUNCTION PANEL
ALT: 28 U/L (ref 10–40)
AST: 28 U/L (ref 14–40)
Alkaline Phosphatase: 51 U/L (ref 25–125)
BILIRUBIN, TOTAL: 0.7 mg/dL

## 2016-08-25 LAB — PSA: PSA: 0.4

## 2016-09-03 ENCOUNTER — Encounter: Payer: Self-pay | Admitting: Family Medicine

## 2016-09-14 ENCOUNTER — Ambulatory Visit: Payer: BLUE CROSS/BLUE SHIELD | Admitting: Family Medicine

## 2016-09-20 ENCOUNTER — Ambulatory Visit: Payer: BLUE CROSS/BLUE SHIELD | Admitting: Family Medicine

## 2016-09-27 ENCOUNTER — Ambulatory Visit (INDEPENDENT_AMBULATORY_CARE_PROVIDER_SITE_OTHER): Payer: BLUE CROSS/BLUE SHIELD | Admitting: Family Medicine

## 2016-09-27 ENCOUNTER — Encounter: Payer: Self-pay | Admitting: Family Medicine

## 2016-09-27 VITALS — BP 108/78 | HR 80 | Temp 98.0°F | Resp 16 | Wt 205.0 lb

## 2016-09-27 DIAGNOSIS — M2550 Pain in unspecified joint: Secondary | ICD-10-CM

## 2016-09-27 DIAGNOSIS — E7800 Pure hypercholesterolemia, unspecified: Secondary | ICD-10-CM | POA: Diagnosis not present

## 2016-09-27 DIAGNOSIS — Z23 Encounter for immunization: Secondary | ICD-10-CM | POA: Diagnosis not present

## 2016-09-27 MED ORDER — GLUCOSAMINE-CHONDROITIN 500-400 MG PO TABS: 1.0000 | ORAL_TABLET | Freq: Three times a day (TID) | ORAL | Status: DC

## 2016-09-27 MED ORDER — TURMERIC 500 MG PO CAPS: 1.0000 | ORAL_CAPSULE | Freq: Every day | ORAL | Status: DC

## 2016-09-27 NOTE — Progress Notes (Signed)
Patient: Ryan RossettiStephen Castro Male    DOB: February 11, 1951   65 y.o.   MRN: [id number] Visit Date: 09/27/2016  Today's Provider: Megan Mansichard Gilbert Jr, MD   Chief Complaint  Patient presents with  . Hyperlipidemia   Subjective:    HPI      Lipid/Cholesterol, Follow-up:   Last seen for this 6 months ago.  Management changes since that visit include check labs. [facility] Total cholesterol- 210, Triglycerides- 40, HDL- 74, LDL- 128. Marland Kitchen. Last Lipid Panel:    Component Value Date/Time   CHOL 172 03/17/2016 0812   TRIG 159 (H) 03/17/2016 0812   HDL 65 03/17/2016 0812   LDLCALC 75 03/17/2016 0812    Risk factors for vascular disease include hypercholesterolemia  Pt reports he D/C his atorvastatin 20 mg "to see if I could manage this without medication". Weight trend: increasing steadily Current diet: in general, a "healthy" diet   Current exercise: 5 days a week; walks 3-4 miles  Wt Readings from Last 3 Encounters:  09/27/16 205 lb (93 kg)  03/15/16 202 lb (91.6 kg)  03/12/15 198 lb (89.8 kg)    -------------------------------------------------------------------   No Known Allergies   Current Outpatient Prescriptions:  .  aspirin 81 MG tablet, Take by mouth., Disp: , Rfl:  .  Multiple Vitamin (MULTIVITAMIN) tablet, Take 1 tablet by mouth daily., Disp: , Rfl:  .  vitamin C (ASCORBIC ACID) 500 MG tablet, Take by mouth., Disp: , Rfl:  .  atorvastatin (LIPITOR) 20 MG tablet, Take 20 mg by mouth. Takes 1/2 tablet once daily, Disp: , Rfl:   Review of Systems  Constitutional: Negative for activity change, appetite change, chills, diaphoresis, fatigue, fever and unexpected weight change.  Eyes: Negative.   Respiratory: Negative for shortness of breath.   Cardiovascular: Negative for chest pain, palpitations and leg swelling.  Gastrointestinal: Negative.   Endocrine: Negative.   Musculoskeletal: Positive for arthralgias.  Allergic/Immunologic:  Negative.   Psychiatric/Behavioral: Negative.     Social History  Substance Use Topics  . Smoking status: Former Smoker    Packs/day: 0.50    Years: 7.00    Types: Cigarettes    Quit date: 11/29/1976  . Smokeless tobacco: Never Used  . Alcohol use Yes     Comment: 2 drinks daily   Objective:   BP 108/78 (BP Location: Left Arm, Patient Position: Sitting, Cuff Size: Large)   Pulse 80   Temp 98 F (36.7 C) (Oral)   Resp 16   Wt 205 lb (93 kg)   BMI 30.49 kg/m   Physical Exam  Constitutional: He is oriented to person, place, and time. He appears well-developed and well-nourished.  HENT:  Head: Normocephalic and atraumatic.  Left Ear: External ear normal.  Nose: Nose normal.  Eyes: Conjunctivae are normal. No scleral icterus.  Neck: Normal range of motion. Neck supple. No thyromegaly present.  Cardiovascular: Normal rate, regular rhythm and normal heart sounds.   Pulmonary/Chest: Effort normal and breath sounds normal. No respiratory distress.  Abdominal: Soft. There is no tenderness.  Musculoskeletal: He exhibits no edema.  Lymphadenopathy:    He has no cervical adenopathy.  Neurological: He is alert and oriented to person, place, and time. No cranial nerve deficit. He exhibits normal muscle tone. Coordination normal.  Skin: Skin is warm and dry.  Psychiatric: He has a normal mood and affect. His behavior is normal.        Assessment & Plan:  1. Arthralgia, unspecified joint Will start OTC medications as below to help with pain. - glucosamine-chondroitin (MAX GLUCOSAMINE CHONDROITIN) 500-400 MG tablet; Take 1 tablet by mouth 3 (three) times daily. - Turmeric 500 MG CAPS; Take 1 capsule by mouth daily.  2. Flu vaccine need Administered today. - Flu vaccine HIGH DOSE PF  3. Hypercholesterolemia Stable off of statin. Pt chooses tp D/C the medication, and work on lifestyle medications. Continue to monitor.   4. Hyperlipidemia 5. Borderline obesity Dietary changes  discussed and back on alcohol to make sure it is one or 2 drinks or less daily as discussed  Patient seen and examined by Julieanne Mansonichard Gilbert, MD, and note scribed by Allene DillonEmily Drozdowski, CMA.  I have done the exam and reviewed the chart and it is accurate to the best of my knowledge. Julieanne Mansonichard Gilbert M.D. [facility]  Megan Mansichard Gilbert Jr, MD  [facility]

## 2017-02-07 ENCOUNTER — Encounter: Payer: Self-pay | Admitting: Family Medicine

## 2017-02-07 ENCOUNTER — Ambulatory Visit (INDEPENDENT_AMBULATORY_CARE_PROVIDER_SITE_OTHER): Payer: BLUE CROSS/BLUE SHIELD | Admitting: Family Medicine

## 2017-02-07 VITALS — BP 122/80 | HR 82 | Temp 98.0°F | Resp 16 | Wt 201.0 lb

## 2017-02-07 DIAGNOSIS — E663 Overweight: Secondary | ICD-10-CM

## 2017-02-07 DIAGNOSIS — E7800 Pure hypercholesterolemia, unspecified: Secondary | ICD-10-CM | POA: Diagnosis not present

## 2017-02-07 DIAGNOSIS — S93492A Sprain of other ligament of left ankle, initial encounter: Secondary | ICD-10-CM

## 2017-02-07 DIAGNOSIS — K2100 Gastro-esophageal reflux disease with esophagitis, without bleeding: Secondary | ICD-10-CM

## 2017-02-07 DIAGNOSIS — S93432A Sprain of tibiofibular ligament of left ankle, initial encounter: Secondary | ICD-10-CM | POA: Diagnosis not present

## 2017-02-07 NOTE — Progress Notes (Signed)
Subjective:  HPI  Lipid/Cholesterol, Follow-up:   Last seen for this 4 months ago.  Management changes since that visit include none. . Last Lipid Panel:    Component Value Date/Time   CHOL 172 03/17/2016 0812   TRIG 159 (H) 03/17/2016 0812   HDL 65 03/17/2016 0812   LDLCALC 75 03/17/2016 0812    Risk factors for vascular disease include hypercholesterolemia  He reports good compliance with treatment. He is not having side effects.  Current symptoms include none and have been unchanged. Weight trend: stable Prior visit with dietician: no Current exercise: walking  Wt Readings from Last 3 Encounters:  02/07/17 201 lb (91.2 kg)  09/27/16 205 lb (93 kg)  03/15/16 202 lb (91.6 kg)    ------------------------------------------------------------------- Pt reports that that he stopped his statin prior to last OV and seems to be doing well off it.  Pt would like his left ankle looked at, he had an injury about 2 weeks ago. He was more bruised and swollen but seems better.    Prior to Admission medications   Medication Sig Start Date End Date Taking? Authorizing Provider  aspirin 81 MG tablet Take by mouth.   Yes Historical Provider, MD  Multiple Vitamin (MULTIVITAMIN) tablet Take 1 tablet by mouth daily.   Yes Historical Provider, MD  vitamin C (ASCORBIC ACID) 500 MG tablet Take by mouth.   Yes Historical Provider, MD  glucosamine-chondroitin (MAX GLUCOSAMINE CHONDROITIN) 500-400 MG tablet Take 1 tablet by mouth 3 (three) times daily. Patient not taking: Reported on 02/07/2017 09/27/16   Maple Hudsonichard L Gilbert Jr., MD  Turmeric 500 MG CAPS Take 1 capsule by mouth daily. Patient not taking: Reported on 02/07/2017 09/27/16   Maple Hudsonichard L Gilbert Jr., MD    Patient Active Problem List   Diagnosis Date Noted  . Anxiety 12/18/2015  . 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 Esophagitis, reflux 12/18/2015  . Gout 12/18/2015  . Hypercholesterolemia 12/18/2015  . Overweight  12/18/2015  . 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 Allergic shock 07/31/2014    History reviewed. No pertinent past medical history.  Social History   Social History  . Marital status: Married    Spouse name: N/A  . Number of children: N/A  . Years of education: N/A   Occupational History  . Not on file.   Social History Main Topics  . Smoking status: Former Smoker    Packs/day: 0.50    Years: 7.00    Types: Cigarettes    Quit date: 11/29/1976  . Smokeless tobacco: Never Used  . Alcohol use Yes     Comment: 2 drinks daily  . Drug use: No  . Sexual activity: Not on file   Other Topics Concern  . Not on file   Social History Narrative  . No narrative on file    No Known Allergies  Review of Systems  Constitutional: Negative.   HENT: Negative.   Eyes: Negative.   Respiratory: Negative.   Cardiovascular: Negative.   Gastrointestinal: Negative.   Genitourinary: Negative.   Musculoskeletal: Positive for joint pain.  Skin: Negative.   Neurological: Negative.   Endo/Heme/Allergies: Negative.   Psychiatric/Behavioral: Negative.     Immunization History  Administered Date(s) Administered  . Influenza, High Dose Seasonal PF 09/27/2016  . Pneumococcal Conjugate-13 04/13/2016  . Tdap 02/21/2008  . Zoster 11/22/2011    Objective:  BP 122/80 (BP Location: Left Arm, Patient Position: Sitting, Cuff Size: Normal)   Pulse  82   Temp 98 F (36.7 C) (Oral)   Resp 16   Wt 201 lb (91.2 kg)   BMI 29.90 kg/m   Physical Exam  Constitutional: He is oriented to person, place, and time and well-developed, well-nourished, and in no distress.  Eyes: Conjunctivae and EOM are normal. Pupils are equal, round, and reactive to light.  Neck: Normal range of motion. Neck supple.  Cardiovascular: Normal rate, regular rhythm, normal heart sounds and intact distal pulses.   Pulmonary/Chest: Effort normal and breath sounds normal.  Musculoskeletal: He  exhibits edema (trace on left ankle.).  Neurological: He is alert and oriented to person, place, and time. He has normal reflexes. Gait normal. GCS score is 15.  Skin: Skin is warm and dry.  Psychiatric: Memory, affect and judgment normal.    Lab Results  Component Value Date   WBC 4.9 03/17/2016   HGB 16.3 08/26/2015   HCT 46.4 03/17/2016   PLT 308 03/17/2016   GLUCOSE 90 03/17/2016   CHOL 172 03/17/2016   TRIG 159 (H) 03/17/2016   HDL 65 03/17/2016   LDLCALC 75 03/17/2016   TSH 3.520 03/17/2016   PSA 0.5 08/26/2015    CMP     Component Value Date/Time   NA 143 03/17/2016 0812   NA 140 06/02/2014 0420   K 4.9 03/17/2016 0812   K 3.5 06/02/2014 0420   CL 102 03/17/2016 0812   CL 106 06/02/2014 0420   CO2 23 03/17/2016 0812   CO2 26 06/02/2014 0420   GLUCOSE 90 03/17/2016 0812   GLUCOSE 123 (H) 06/02/2014 0420   BUN 15 03/17/2016 0812   BUN 19 (H) 06/02/2014 0420   CREATININE 0.87 03/17/2016 0812   CREATININE 1.06 06/02/2014 0420   CALCIUM 9.8 03/17/2016 0812   CALCIUM 8.6 06/02/2014 0420   PROT 6.8 03/17/2016 0812   ALBUMIN 4.5 03/17/2016 0812   AST 29 03/17/2016 0812   ALT 36 03/17/2016 0812   ALKPHOS 69 03/17/2016 0812   BILITOT 0.8 03/17/2016 0812   GFRNONAA 91 03/17/2016 0812   GFRNONAA >60 06/02/2014 0420   GFRAA 105 03/17/2016 0812   GFRAA >60 06/02/2014 0420    Assessment and Plan :  1. Hypercholesterolemia Stable off statin. Continue diet and exercise  2. Esophagitis, reflux Stable  3. High ankle sprain of left lower extremity, initial encounter Will call if not continuing to improve.   4. Overweight   HPI, Exam, and A&P Transcribed under the direction and in the presence of Richard L. Wendelyn Breslow, MD  Electronically Signed: Silvio Pate, CMA I have done the exam and reviewed the chart and it is accurate to the best of my knowledge. Dentist has been used and  any errors in dictation or transcription are unintentional. Julieanne Manson M.D. [facility]  Julieanne Manson MD [facility] 02/07/2017 8:43 AM

## 2017-02-12 LAB — LIPID PANEL WITH LDL/HDL RATIO
Cholesterol, Total: 227 mg/dL — ABNORMAL HIGH (ref 100–199)
HDL: 77 mg/dL (ref >39)
LDL CALC: 127 mg/dL — AB (ref 0–99)
LDL/HDL RATIO: 1.6 ratio (ref 0.0–3.6)
Triglycerides: 114 mg/dL (ref 0–149)
VLDL CHOLESTEROL CAL: 23 mg/dL (ref 5–40)

## 2017-02-12 LAB — TSH: TSH: 4.65 u[IU]/mL — ABNORMAL HIGH (ref 0.450–4.500)

## 2017-02-12 LAB — COMPREHENSIVE METABOLIC PANEL
ALK PHOS: 51 IU/L (ref 39–117)
ALT: 28 IU/L (ref 0–44)
AST: 29 IU/L (ref 0–40)
Albumin/Globulin Ratio: 1.8 (ref 1.2–2.2)
Albumin: 4.2 g/dL (ref 3.6–4.8)
BILIRUBIN TOTAL: 1 mg/dL (ref 0.0–1.2)
BUN/Creatinine Ratio: 16 (ref 10–24)
BUN: 14 mg/dL (ref 8–27)
CO2: 27 mmol/L (ref 18–29)
Calcium: 9.3 mg/dL (ref 8.6–10.2)
Chloride: 101 mmol/L (ref 96–106)
Creatinine, Ser: 0.87 mg/dL (ref 0.76–1.27)
GFR calc Af Amer: 104 mL/min/{1.73_m2} (ref >59)
GFR, EST NON AFRICAN AMERICAN: 90 mL/min/{1.73_m2} (ref >59)
Globulin, Total: 2.4 g/dL (ref 1.5–4.5)
Glucose: 90 mg/dL (ref 65–99)
Potassium: 4.3 mmol/L (ref 3.5–5.2)
Sodium: 144 mmol/L (ref 134–144)
Total Protein: 6.6 g/dL (ref 6.0–8.5)

## 2017-06-15 ENCOUNTER — Ambulatory Visit (INDEPENDENT_AMBULATORY_CARE_PROVIDER_SITE_OTHER): Payer: Private Health Insurance - Indemnity | Admitting: Family Medicine

## 2017-06-15 ENCOUNTER — Encounter: Payer: Self-pay | Admitting: Family Medicine

## 2017-06-15 VITALS — BP 110/82 | HR 64 | Temp 98.2°F | Resp 14 | Ht 67.75 in | Wt 198.0 lb

## 2017-06-15 DIAGNOSIS — R7989 Other specified abnormal findings of blood chemistry: Secondary | ICD-10-CM

## 2017-06-15 DIAGNOSIS — R946 Abnormal results of thyroid function studies: Secondary | ICD-10-CM | POA: Diagnosis not present

## 2017-06-15 DIAGNOSIS — E7800 Pure hypercholesterolemia, unspecified: Secondary | ICD-10-CM

## 2017-06-15 DIAGNOSIS — Z1211 Encounter for screening for malignant neoplasm of colon: Secondary | ICD-10-CM

## 2017-06-15 DIAGNOSIS — Z0000 Encounter for general adult medical examination without abnormal findings: Secondary | ICD-10-CM | POA: Diagnosis not present

## 2017-06-15 DIAGNOSIS — Z125 Encounter for screening for malignant neoplasm of prostate: Secondary | ICD-10-CM

## 2017-06-15 DIAGNOSIS — Z23 Encounter for immunization: Secondary | ICD-10-CM | POA: Diagnosis not present

## 2017-06-15 LAB — IFOBT (OCCULT BLOOD): IFOBT: NEGATIVE

## 2017-06-15 NOTE — Progress Notes (Signed)
Patient: Ryan RossettiStephen Castro, Male    DOB: 1951-11-07, 66 y.o.   MRN: [id number] Visit Date: 06/15/2017  Today's Provider: Megan Mansichard Gilbert Jr, MD   Chief Complaint  Patient presents with  . Annual Exam   Subjective:  Ryan Castro is a 66 y.o. male who presents today for health maintenance and complete physical. He feels well. He reports exercising daily walks  miles. He reports he is sleeping well. Immunization History  Administered Date(s) Administered  . Influenza, High Dose Seasonal PF 09/27/2016  . Pneumococcal Conjugate-13 04/13/2016  . Tdap 02/21/2008  . Zoster 11/22/2011   Last colonoscopy 12/05/13 tubular adenoma done by Dr Mechele CollinElliott. Lab work- 02/11/17 TSH, MetC and Lipid-TSH needs to be repeate.  Review of Systems  Constitutional: Negative.   HENT: Negative.   Eyes: Negative.   Respiratory: Negative.   Cardiovascular: Negative.   Gastrointestinal: Negative.   Endocrine: Negative.   Genitourinary: Negative.   Musculoskeletal: Positive for arthralgias.  Skin: Negative.   Allergic/Immunologic: Negative.   Neurological: Negative.   Hematological: Negative.   Psychiatric/Behavioral: Negative.     Social History   Social History  . Marital status: Married    Spouse name: N/A  . Number of children: N/A  . Years of education: N/A   Occupational History  . Not on file.   Social History Main Topics  . Smoking status: Former Smoker    Packs/day: 0.50    Years: 7.00    Types: Cigarettes    Quit date: 11/29/1976  . Smokeless tobacco: Never Used  . Alcohol use Yes     Comment: 2 drinks daily  . Drug use: No  . Sexual activity: Not on file   Other Topics Concern  . Not on file   Social History Narrative  . No narrative on file    Patient Active Problem List   Diagnosis Date Noted  . Anxiety 12/18/2015  . 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 Esophagitis, reflux 12/18/2015  . Gout 12/18/2015  . Hypercholesterolemia 12/18/2015  . Overweight 12/18/2015  .  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 Allergic shock 07/31/2014    Past Surgical History:  Procedure Laterality Date  . BACK SURGERY     due to ruptured disk at L4 and L5 level  . COLONOSCOPY  [id number]   multiple polyps, internal hemorrhoids, diverticulosis, path report showed tubular adenoma  . HAND SURGERY     left, growth on tendon    His family history includes Breast cancer in his mother; Diabetes in his brother and sister; Fibromyalgia in his sister; Gout in his father; Lung cancer in his father; Renal cancer in his father; Thyroid cancer in his father.     Outpatient Encounter Prescriptions as of 06/15/2017  Medication Sig Note  . aspirin 81 MG tablet Take by mouth. 12/18/2015: [facility] from: [facility]  . Multiple Vitamin (MULTIVITAMIN) tablet Take 1 tablet by mouth daily.   . Omega-3 Fatty Acids (FISH OIL) 1000 MG CAPS Take by mouth daily.   . vitamin C (ASCORBIC ACID) 500 MG tablet Take by mouth. 12/18/2015: Re[facility] from: [facility]  . [DISCONTINUED] glucosamine-chondroitin (MAX GLUCOSAMINE CHONDROITIN) 500-400 MG tablet Take 1 tablet by mouth 3 (three) times daily. (Patient not taking: Reported on 02/07/2017)   . [DISCONTINUED] Turmeric 500 MG CAPS Take 1 capsule by mouth daily. (Patient not taking: Reported on 02/07/2017)    No facility-administered encounter medications on file as of 06/15/2017.     Patient Care  Team: Maple Hudson., MD as PCP - General (Family Medicine)      Objective:   Vitals:  Vitals:   06/15/17 0943  BP: 110/82  Pulse: 64  Resp: 14  Temp: 98.2 F (36.8 C)  Weight: 198 lb (89.8 kg)  Height: 5' 7.75" (1.721 m)    Physical Exam  Constitutional: He is oriented to person, place, and time. He appears well-developed and well-nourished.  HENT:  Head: Normocephalic and atraumatic.  Right Ear: External ear normal.  Left Ear: External ear normal.  Mouth/Throat: Oropharynx is  clear and moist.  Eyes: Pupils are equal, round, and reactive to light. Conjunctivae are normal.  Neck: Normal range of motion. Neck supple.  Cardiovascular: Normal rate, regular rhythm, normal heart sounds and intact distal pulses.  Exam reveals no gallop.   No murmur heard. Pulmonary/Chest: Effort normal and breath sounds normal. No respiratory distress. He has no wheezes.  Abdominal: Soft. He exhibits no distension. There is no tenderness.  Genitourinary: Rectum normal, prostate normal and penis normal. Rectal exam shows guaiac negative stool. No penile tenderness.  Musculoskeletal: He exhibits no edema or tenderness.  Neurological: He is alert and oriented to person, place, and time. No cranial nerve deficit. Coordination normal.  Skin: Skin is warm and dry. No rash noted. No erythema.  Skin lesion upper nose. Possible SCC/BCC.  Psychiatric: He has a normal mood and affect. His behavior is normal. Judgment and thought content normal.   Depression Screen PHQ 2/9 Scores 06/15/2017 03/15/2016  PHQ - 2 Score 0 0  PHQ- 9 Score 0 -   Assessment & Plan:    1. Annual physical exam - CBC with Differential/Platelet - TSH - Comprehensive metabolic panel - Lipid Panel With LDL/HDL Ratio  2. Colon cancer screening  3. Abnormal thyroid blood test Re check, patient is not on medication treatment at this time. - TSH  4. Need for 23-polyvalent pneumococcal polysaccharide vaccine Administered Pneumovax 23.  5. Hypercholesterolemia Patient is not on statin treatment, check levels today. - Comprehensive metabolic panel - Lipid Panel With LDL/HDL Ratio  6. Prostate cancer screening - PSA 7.Skin lesion on nose Refer to Derm.  HPI, Exam and A&P transcribed by Samara Deist, RMA under direction and in the presence of Julieanne Manson, MD. I have done the exam and reviewed the chart and it is accurate to the best of my knowledge. Dentist has been used and  any errors in  dictation or transcription are unintentional. Julieanne Manson M.D. [facility]

## 2017-06-27 ENCOUNTER — Encounter: Payer: BLUE CROSS/BLUE SHIELD | Admitting: Family Medicine

## 2017-07-02 LAB — CBC WITH DIFFERENTIAL/PLATELET
BASOS: 1 %
Basophils Absolute: 0 10*3/uL (ref 0.0–0.2)
EOS (ABSOLUTE): 0.2 10*3/uL (ref 0.0–0.4)
EOS: 3 %
HEMATOCRIT: 46.2 % (ref 37.5–51.0)
HEMOGLOBIN: 15.6 g/dL (ref 13.0–17.7)
IMMATURE GRANS (ABS): 0 10*3/uL (ref 0.0–0.1)
IMMATURE GRANULOCYTES: 1 %
LYMPHS: 36 %
Lymphocytes Absolute: 1.8 10*3/uL (ref 0.7–3.1)
MCH: 32.6 pg (ref 26.6–33.0)
MCHC: 33.8 g/dL (ref 31.5–35.7)
MCV: 97 fL (ref 79–97)
Monocytes Absolute: 0.4 10*3/uL (ref 0.1–0.9)
Monocytes: 9 %
NEUTROS ABS: 2.5 10*3/uL (ref 1.4–7.0)
NEUTROS PCT: 50 %
Platelets: 238 10*3/uL (ref 150–379)
RBC: 4.79 x10E6/uL (ref 4.14–5.80)
RDW: 13.8 % (ref 12.3–15.4)
WBC: 5 10*3/uL (ref 3.4–10.8)

## 2017-07-02 LAB — TSH: TSH: 3.17 u[IU]/mL (ref 0.450–4.500)

## 2017-07-02 LAB — COMPREHENSIVE METABOLIC PANEL
A/G RATIO: 2 (ref 1.2–2.2)
ALBUMIN: 4.2 g/dL (ref 3.6–4.8)
ALT: 21 IU/L (ref 0–44)
AST: 27 IU/L (ref 0–40)
Alkaline Phosphatase: 56 IU/L (ref 39–117)
BUN/Creatinine Ratio: 18 (ref 10–24)
BUN: 16 mg/dL (ref 8–27)
Bilirubin Total: 0.8 mg/dL (ref 0.0–1.2)
CALCIUM: 9.9 mg/dL (ref 8.6–10.2)
CO2: 25 mmol/L (ref 20–29)
CREATININE: 0.91 mg/dL (ref 0.76–1.27)
Chloride: 101 mmol/L (ref 96–106)
GFR calc Af Amer: 100 mL/min/{1.73_m2} (ref >59)
GFR calc non Af Amer: 87 mL/min/{1.73_m2} (ref >59)
GLOBULIN, TOTAL: 2.1 g/dL (ref 1.5–4.5)
Glucose: 100 mg/dL — ABNORMAL HIGH (ref 65–99)
Potassium: 5.2 mmol/L (ref 3.5–5.2)
Sodium: 141 mmol/L (ref 134–144)
Total Protein: 6.3 g/dL (ref 6.0–8.5)

## 2017-07-02 LAB — LIPID PANEL WITH LDL/HDL RATIO
CHOLESTEROL TOTAL: 200 mg/dL — AB (ref 100–199)
HDL: 71 mg/dL (ref >39)
LDL CALC: 115 mg/dL — AB (ref 0–99)
LDl/HDL Ratio: 1.6 ratio (ref 0.0–3.6)
TRIGLYCERIDES: 72 mg/dL (ref 0–149)
VLDL Cholesterol Cal: 14 mg/dL (ref 5–40)

## 2017-07-02 LAB — PSA: Prostate Specific Ag, Serum: 0.6 ng/mL (ref 0.0–4.0)

## 2017-08-03 ENCOUNTER — Telehealth: Payer: Self-pay | Admitting: Family Medicine

## 2017-08-03 NOTE — Telephone Encounter (Signed)
Pt would like to pick up a copy of his last labs.    His call back [phone]  Thanks teri

## 2017-08-03 NOTE — Telephone Encounter (Signed)
Lab results printed at front desk for pick up. Patient advised.

## 2017-09-13 ENCOUNTER — Other Ambulatory Visit: Payer: Self-pay | Admitting: Student

## 2017-09-13 DIAGNOSIS — K30 Functional dyspepsia: Secondary | ICD-10-CM

## 2017-09-13 DIAGNOSIS — R112 Nausea with vomiting, unspecified: Secondary | ICD-10-CM

## 2017-09-19 ENCOUNTER — Ambulatory Visit
Admission: RE | Admit: 2017-09-19 | Discharge: 2017-09-19 | Disposition: A | Discharge status: Home / Self Care | Payer: Private Health Insurance - Indemnity | Source: Ambulatory Visit | Attending: Student | Admitting: Student

## 2017-09-19 DIAGNOSIS — R112 Nausea with vomiting, unspecified: Secondary | ICD-10-CM | POA: Diagnosis present

## 2017-09-19 DIAGNOSIS — K449 Diaphragmatic hernia without obstruction or gangrene: Secondary | ICD-10-CM | POA: Diagnosis not present

## 2017-09-19 DIAGNOSIS — K30 Functional dyspepsia: Secondary | ICD-10-CM | POA: Diagnosis present

## 2017-09-19 DIAGNOSIS — I700 Atherosclerosis of aorta: Secondary | ICD-10-CM | POA: Diagnosis not present

## 2017-09-19 DIAGNOSIS — R109 Unspecified abdominal pain: Secondary | ICD-10-CM | POA: Insufficient documentation

## 2017-12-12 ENCOUNTER — Ambulatory Visit: Payer: Private Health Insurance - Indemnity | Admitting: Family Medicine

## 2018-10-11 ENCOUNTER — Ambulatory Visit: Payer: Private Health Insurance - Indemnity | Admitting: Family Medicine

## 2018-10-14 ENCOUNTER — Ambulatory Visit: Payer: Private Health Insurance - Indemnity | Admitting: Family Medicine

## 2018-12-27 ENCOUNTER — Ambulatory Visit (INDEPENDENT_AMBULATORY_CARE_PROVIDER_SITE_OTHER): Payer: Private Health Insurance - Indemnity | Admitting: Family Medicine

## 2018-12-27 VITALS — BP 120/84 | HR 90 | Temp 98.2°F | Resp 16 | Wt 201.0 lb

## 2018-12-27 DIAGNOSIS — K2100 Gastro-esophageal reflux disease with esophagitis, without bleeding: Secondary | ICD-10-CM

## 2018-12-27 DIAGNOSIS — E7800 Pure hypercholesterolemia, unspecified: Secondary | ICD-10-CM | POA: Diagnosis not present

## 2018-12-27 DIAGNOSIS — R42 Dizziness and giddiness: Secondary | ICD-10-CM

## 2018-12-27 MED ORDER — MECLIZINE HCL 25 MG PO TABS: 25.0000 mg | ORAL_TABLET | Freq: Three times a day (TID) | ORAL | 1 refills | Status: DC | PRN

## 2018-12-27 NOTE — Progress Notes (Signed)
Ryan Castro  MRN: [id number] DOB: [birth year]  Subjective:  HPI   The patient is a 68 year old male who presents with symptoms of vertigo.  He states he woke up this morning and before getting out of bed he felt that the room was spinning.  Since that time he has had the feeling of leaning one way.  He states that when he got here and was walking across the parking lot he felt like he was stumbling. Patient states that when he looks up of down it can cause the vertigo to start. No other symptoms, no problems with speech, no weakness of extremities. No chest pain, shortness of breath, diaphoresis, or any cardiac symptoms. Patient Active Problem List   Diagnosis Date Noted  . Anxiety 12/18/2015  . 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 Esophagitis, reflux 12/18/2015  . Gout 12/18/2015  . Hypercholesterolemia 12/18/2015  . Overweight 12/18/2015  . 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 Allergic shock 07/31/2014    No past medical history on file.  Social History   Socioeconomic History  . Marital status: Married    Spouse name: Not on file  . Number of children: Not on file  . Years of education: Not on file  . Highest education level: Not on file  Occupational History  . Not on file  Social Needs  . Financial resource strain: Not on file  . Food insecurity:    Worry: Not on file    Inability: Not on file  . Transportation needs:    Medical: Not on file    Non-medical: Not on file  Tobacco Use  . Smoking status: Former Smoker    Packs/day: 0.50    Years: 7.00    Pack years: 3.50    Types: Cigarettes    Last attempt to quit: 11/29/1976    Years since quitting: 42.1  . Smokeless tobacco: Never Used  Substance and Sexual Activity  . Alcohol use: Yes    Comment: 2 drinks daily  . Drug use: No  . Sexual activity: Not on file  Lifestyle  . Physical activity:    Days per week: Not on file    Minutes per session: Not on file  . Stress:  Not on file  Relationships  . Social connections:    Talks on phone: Not on file    Gets together: Not on file    Attends religious service: Not on file    Active member of club or organization: Not on file    Attends meetings of clubs or organizations: Not on file    Relationship status: Not on file  . Intimate partner violence:    Fear of current or ex partner: Not on file    Emotionally abused: Not on file    Physically abused: Not on file    Forced sexual activity: Not on file  Other Topics Concern  . Not on file  Social History Narrative  . Not on file    Outpatient Encounter Medications as of 12/27/2018  Medication Sig Note  . aspirin 81 MG tablet Take by mouth. 12/18/2015: Received from: [facility]  . Multiple Vitamin (MULTIVITAMIN) tablet Take 1 tablet by mouth daily.   . Omega-3 Fatty Acids (FISH OIL) 1000 MG CAPS Take by mouth daily.   . vitamin C (ASCORBIC ACID) 500 MG tablet Take by mouth. 12/18/2015: Received from: [facility]   No facility-administered encounter medications on file as of  12/27/2018.     No Known Allergies  Review of Systems  Constitutional: Negative for fever and malaise/fatigue.  HENT: Positive for ear pain (itching). Negative for congestion, ear discharge, nosebleeds, sinus pain, sore throat and tinnitus.   Eyes: Positive for blurred vision. Negative for double vision, photophobia, pain, discharge and redness.  Respiratory: Negative for cough, shortness of breath and wheezing.   Cardiovascular: Negative for chest pain, palpitations and leg swelling.  Gastrointestinal: Negative.   Neurological: Positive for dizziness.  Endo/Heme/Allergies: Negative.   Psychiatric/Behavioral: Negative.     Objective:  BP 120/84 (BP Location: Right Arm, Patient Position: Sitting, Cuff Size: Normal)   Pulse 90   Temp 98.2 F (36.8 C) (Oral)   Resp 16   Wt 201 lb (91.2 kg)   BMI 30.79 kg/m   Physical Exam    Constitutional: He is oriented to person, place, and time and well-developed, well-nourished, and in no distress.  Eyes: Pupils are equal, round, and reactive to light. Conjunctivae and EOM are normal.  Neck: Normal range of motion. Neck supple.  Cardiovascular: Normal rate, regular rhythm, normal heart sounds and intact distal pulses.  Pulmonary/Chest: Effort normal and breath sounds normal.  Musculoskeletal:        General: Edema (trace on left ankle.) present.  Neurological: He is alert and oriented to person, place, and time. He has normal reflexes. Gait normal. GCS score is 15.  Nystagmus noted on exam.  Gait is normal unless he moves his head quickly and then he becomes unstable.  Otherwise nonfocal exam.  Skin: Skin is warm and dry.  Psychiatric: Memory, affect and judgment normal.    Assessment and Plan :  1. Vertigo Patient advised if he worsens to go the emergency room.  Advised fluids and rest in addition to the meclizine.  Consider diazepam.  ECG not obtained as pulses strong and heart rate is regular. - meclizine (ANTIVERT) 25 MG tablet; Take 1 tablet (25 mg total) by mouth 3 (three) times daily as needed for dizziness.  Dispense: 30 tablet; Refill: 1  2. Hypercholesterolemia   3. Esophagitis, reflux  I have done the exam and reviewed the chart and it is accurate to the best of my knowledge. Dentist has been used and  any errors in dictation or transcription are unintentional. Julieanne Manson M.D. [facility]

## 2018-12-27 NOTE — Progress Notes (Deleted)
       Patient: Dianna RossettiStephen Rogness Male    DOB: 1951/03/12   68 y.o.   MRN: [id number] Visit Date: 12/27/2018  Today's Provider: Megan Mansichard Gilbert Jr, MD   No chief complaint on file.  Subjective:     HPI  No Known Allergies   Current Outpatient Medications:  .  aspirin 81 MG tablet, Take by mouth., Disp: , Rfl:  .  Multiple Vitamin (MULTIVITAMIN) tablet, Take 1 tablet by mouth daily., Disp: , Rfl:  .  Omega-3 Fatty Acids (FISH OIL) 1000 MG CAPS, Take by mouth daily., Disp: , Rfl:  .  vitamin C (ASCORBIC ACID) 500 MG tablet, Take by mouth., Disp: , Rfl:   Review of Systems  Social History   Tobacco Use  . Smoking status: Former Smoker    Packs/day: 0.50    Years: 7.00    Pack years: 3.50    Types: Cigarettes    Last attempt to quit: 11/29/1976    Years since quitting: 42.1  . Smokeless tobacco: Never Used  Substance Use Topics  . Alcohol use: Yes    Comment: 2 drinks daily      Objective:   There were no vitals taken for this visit. There were no vitals filed for this visit.   Physical Exam      Assessment & Plan        Megan Mansichard Gilbert Jr, MD  [facility]

## 2018-12-29 ENCOUNTER — Other Ambulatory Visit: Payer: Self-pay | Admitting: Physician Assistant

## 2018-12-29 DIAGNOSIS — Z20828 Contact with and (suspected) exposure to other viral communicable diseases: Secondary | ICD-10-CM

## 2018-12-29 MED ORDER — OSELTAMIVIR PHOSPHATE 75 MG PO CAPS: 75.0000 mg | ORAL_CAPSULE | Freq: Every day | ORAL | 0 refills | Status: AC

## 2019-01-04 ENCOUNTER — Telehealth: Payer: Self-pay | Admitting: Family Medicine

## 2019-01-04 MED ORDER — DIAZEPAM 2 MG PO TABS: ORAL_TABLET | ORAL | 0 refills | Status: DC

## 2019-01-04 NOTE — Telephone Encounter (Signed)
Please review. Thanks!  

## 2019-01-04 NOTE — Telephone Encounter (Signed)
Dizziness may be a little better.  Sent in diazepam to use as needed.  Told him if it worsens to go to the ED or if not better in a few days to see ENT or more likely neurology.  I have already spoken with patient and sent in prescription.  No action needed

## 2019-01-04 NOTE — Telephone Encounter (Signed)
Pt called saying he seen Dr. Sullivan Lone the end of Jan for vertigo.  He is still having vertigo and would like to find out what he needs to do next.  His CB#  [phone]  Thanks Barth Kirks

## 2019-01-29 ENCOUNTER — Telehealth: Payer: Self-pay | Admitting: Family Medicine

## 2019-01-29 NOTE — Telephone Encounter (Signed)
Pt is calling back about his vertigo.  He was in about 2 weeks ago.Marland Kitchen He states its not a lot better  CB#   [phone]  Thanks Barth Kirks

## 2019-01-29 NOTE — Telephone Encounter (Signed)
Please review

## 2019-01-30 ENCOUNTER — Other Ambulatory Visit: Payer: Self-pay

## 2019-01-30 DIAGNOSIS — G450 Vertebro-basilar artery syndrome: Secondary | ICD-10-CM

## 2019-03-13 ENCOUNTER — Telehealth: Payer: Self-pay

## 2019-03-13 NOTE — Telephone Encounter (Signed)
Please review. Have you seen anything?

## 2019-03-13 NOTE — Telephone Encounter (Signed)
Patient states that [employer] send him a bill from his office visit with Dr. Sullivan Lone on 12/27/2018. Patient states that Monia Pouch told him they are waiting on a reply from Dr. Sullivan Lone for a request for more details from the office visit. Patient wants to know if Dr. Sullivan Lone received anything from [facility] about this matter and if he could try to re run the charges. Patient is requesting a call back. Please advise.

## 2019-03-15 NOTE — Telephone Encounter (Signed)
Sorry, I have seen nothing from [employer].

## 2019-07-05 IMAGING — RF DG UGI W/ SMALL BOWEL HIGH DENSITY
12 of 17 series · 14 of 24 positions shown · non-contrast
Comparison: None.

CLINICAL DATA: 66-year-old male with abdominal pain. Worse after
eating fatty food. Initial encounter.

EXAM:
UPPER GI SERIES WITH SMALL BOWEL FOLLOW-THROUGH
FLUOROSCOPY TIME:  Fluoroscopy Time:  2 minutes and 6 seconds
Radiation Exposure Index (if provided by the fluoroscopic device):
109.9 mGy
TECHNIQUE: Combined double contrast and single contrast upper GI series using
effervescent crystals, thick barium, and thin barium. Subsequently,
serial images of the small bowel were obtained including spot views
of the terminal ileum.

[Series 1: t abdomen supine · 0.15mm/px · 1 of 1 slices shown]
[im 1/1]
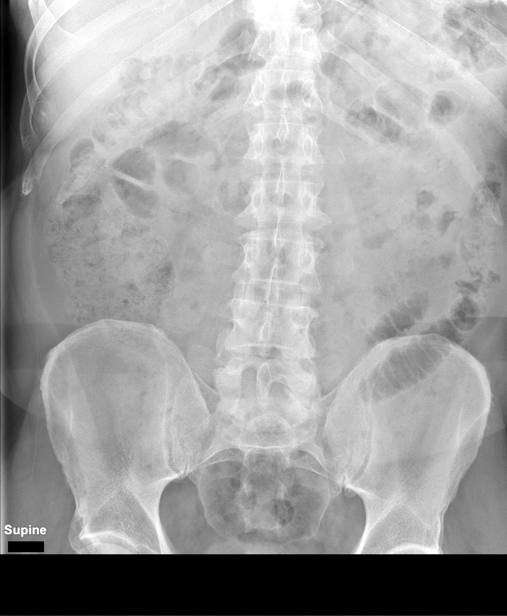

[Series 2: fluoro_barium 2fps_bw · 0.17mm/px · 1 of 11 frames shown (1 of 4)]
[frame 6/11]
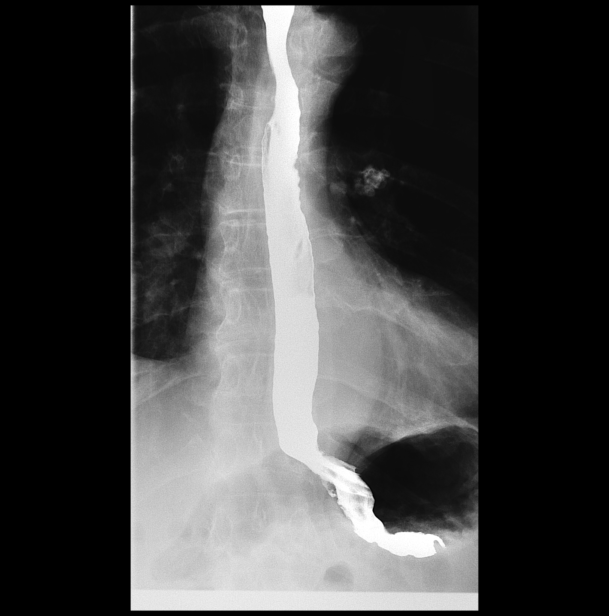

[Series 4: cp_standard · 0.26mm/px · 1 of 1 slices shown (1 of 7)]
[im 1/1]
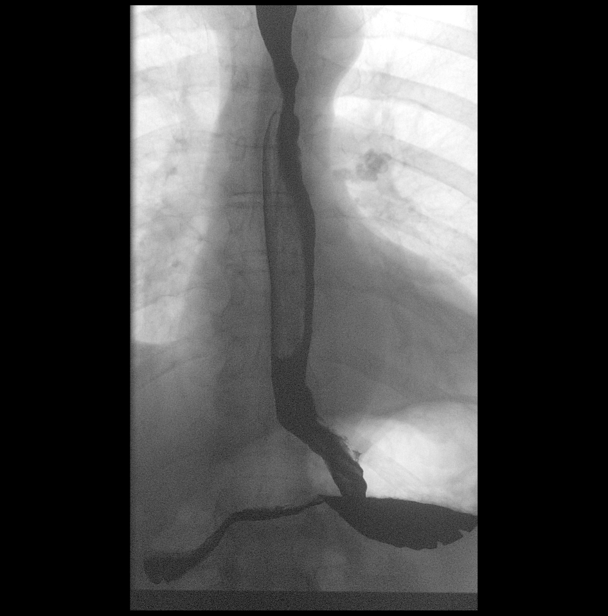

[Series 6: fluoro_barium 2fps_bw · 0.17mm/px · 2 of 11 frames shown (2 of 4)]
[frame 3/11]
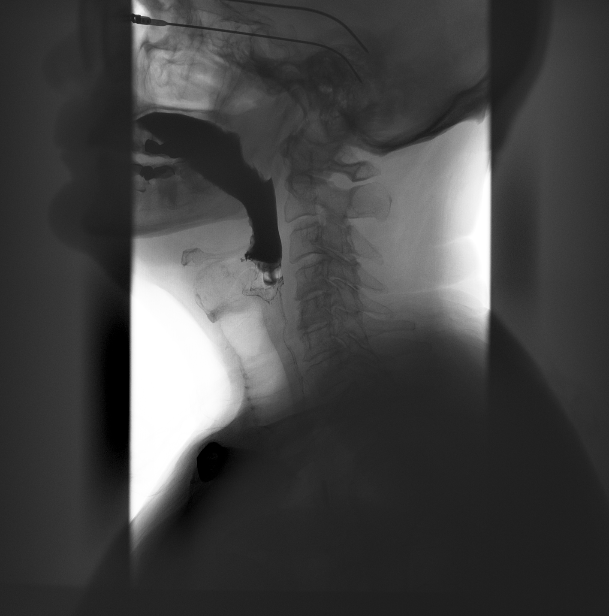
[frame 6/11]
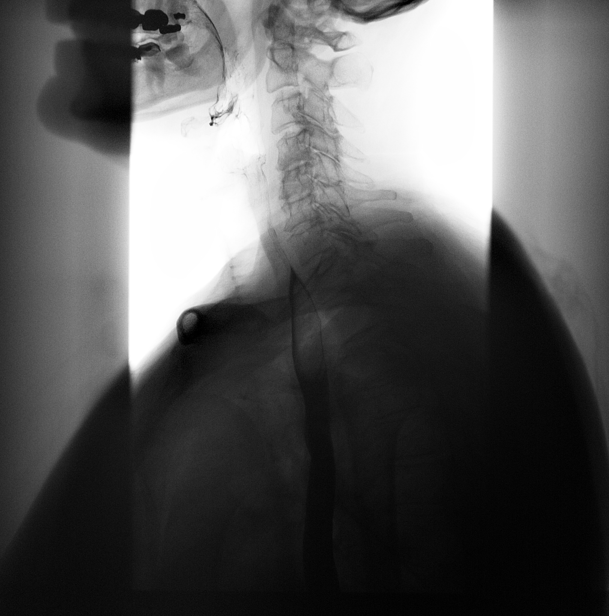

[Series 8: cp_standard · 0.27mm/px · 1 of 1 slices shown (2 of 7)]
[im 1/1]
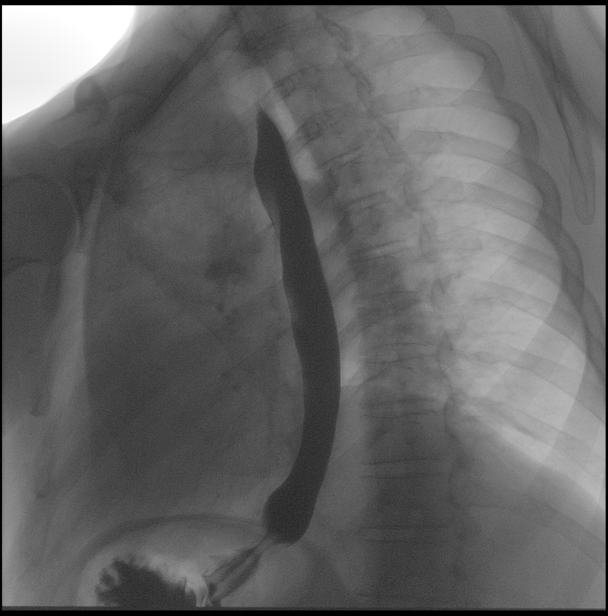

[Series 11: cp_standard · 0.27mm/px · 1 of 1 slices shown (3 of 7)]
[im 1/1]
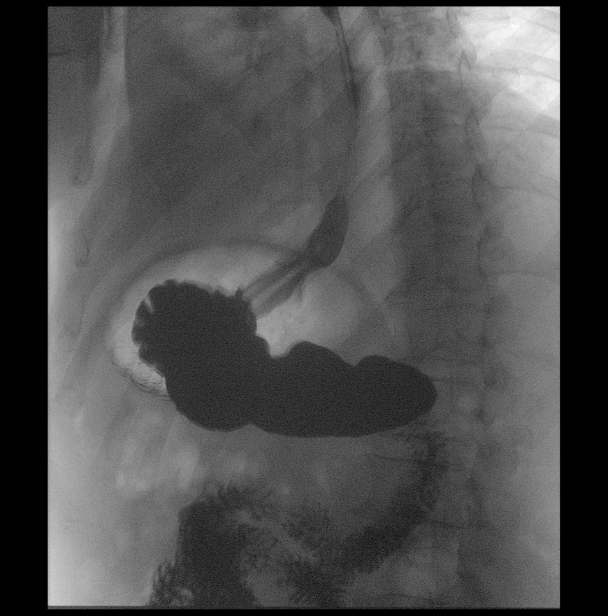

[Series 12: cp_standard · 0.27mm/px · 1 of 1 slices shown (4 of 7)]
[im 1/1]
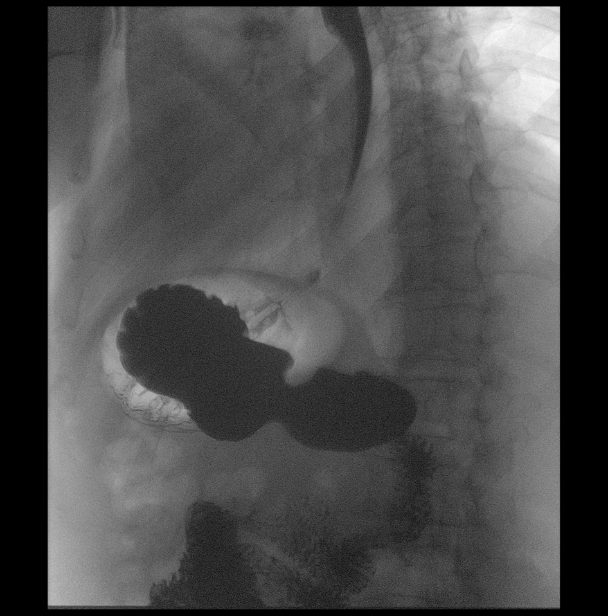

[Series 15: cp_standard · 0.26mm/px · 1 of 1 slices shown (5 of 7)]
[im 1/1]
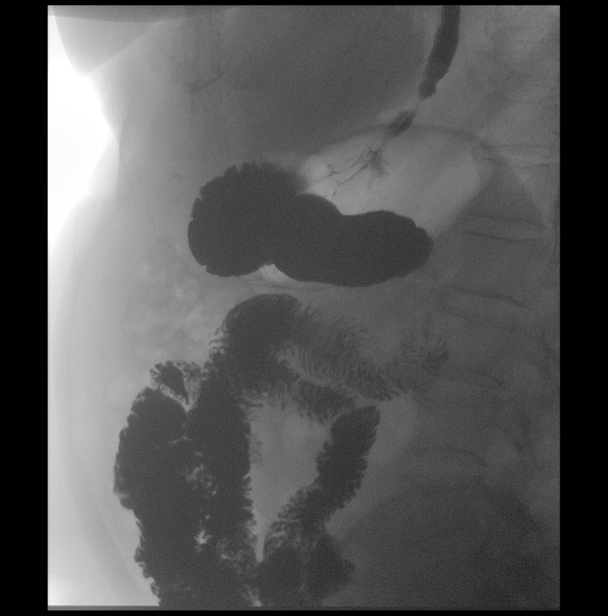

[Series 18: cp_standard · 0.26mm/px · 1 of 1 slices shown (6 of 7)]
[im 1/1]
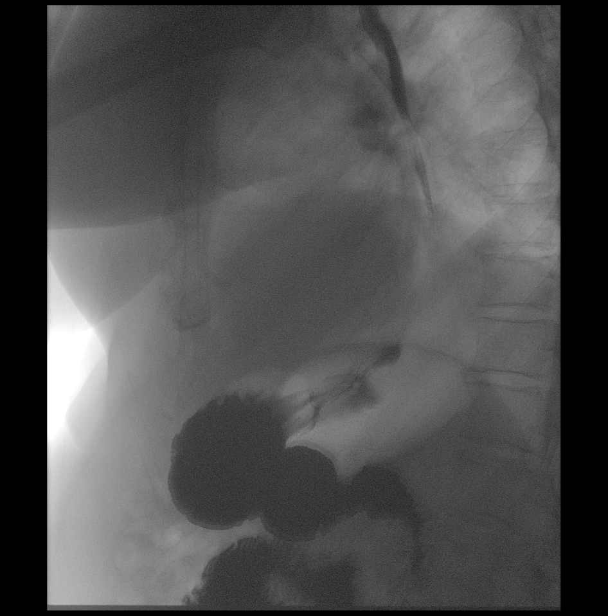

[Series 21: fluoro_barium 2fps_bw · 0.18mm/px · 2 of 5 frames shown (3 of 4)]
[frame 1/5]
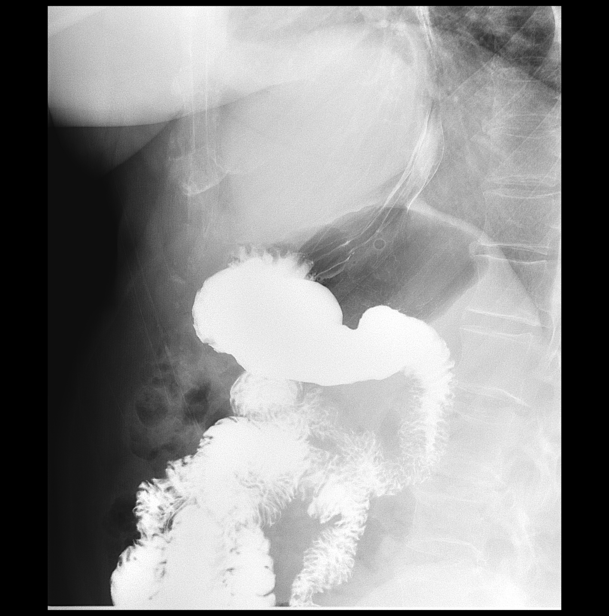
[frame 3/5]
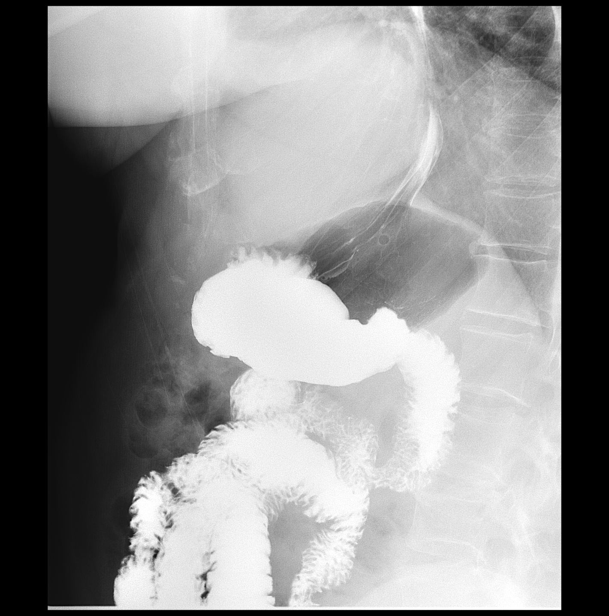

[Series 22: cp_standard · 0.26mm/px · 1 of 1 slices shown (7 of 7)]
[im 1/1]
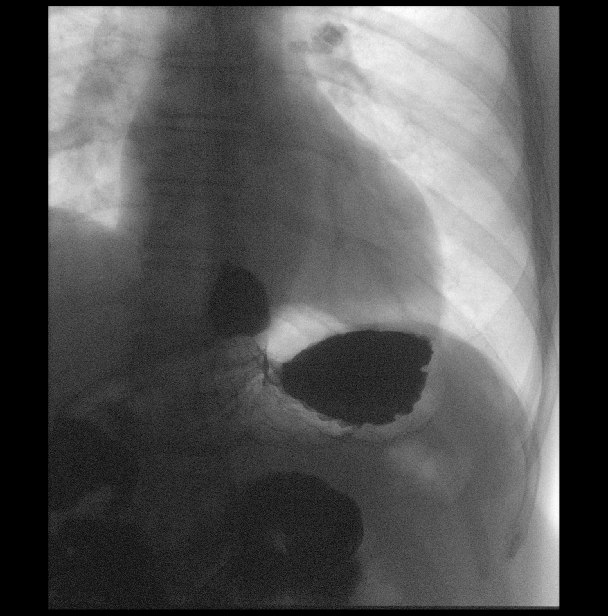

[Series 24: fluoro_barium 2fps_bw · 0.18mm/px · 1 of 2 frames shown (4 of 4)]
[frame 2/2]
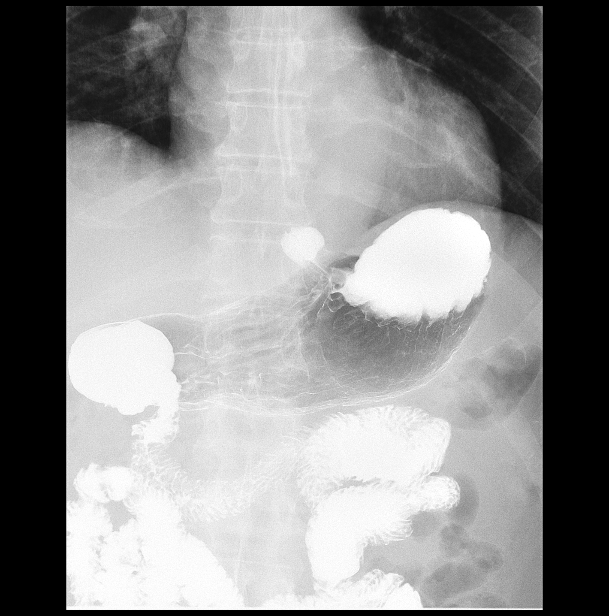

[14 of 24 positions shown; findings below may reference images not displayed]

FINDINGS: Scout view reveals top-normal size gas-filled small bowel loops left
lower quadrant.

Calcification left hilum may represent result of prior granulomatous
exposure and commented upon on 07/31/2003 chest x-ray report.

Calcified aortic knob.

Cervical spondylotic changes C4-5 and C5-6.

No laryngeal penetration or aspiration.

Normal primary esophageal stripping wave without esophageal
obstructing or constricting lesion.

Tiny sliding type hiatal hernia with trace reflux with change of
patient position.

No gastric mass or ulceration.

No duodenal bulb ulceration.  Ligament of Treitz normal position.

Barium traveled throughout the small bowel entering the colon by 35
minutes after beginning examination. No small bowel obstructing or
constricting lesion. No small bowel ulceration or dilation. Spot
views of the terminal ileum within normal limits.
IMPRESSION: Tiny sliding type hiatal hernia with trace gastroesophageal reflux
with change of patient position.

Remainder of upper GI series and small bowel follow-through
unremarkable.

Aortic Atherosclerosis (RTWYB-215.5) as indicated by calcified
aortic knob.

## 2019-11-26 ENCOUNTER — Ambulatory Visit: Payer: Self-pay | Admitting: *Deleted

## 2019-11-26 DIAGNOSIS — I160 Hypertensive urgency: Secondary | ICD-10-CM | POA: Insufficient documentation

## 2019-11-26 DIAGNOSIS — K922 Gastrointestinal hemorrhage, unspecified: Secondary | ICD-10-CM | POA: Insufficient documentation

## 2019-11-26 NOTE — Telephone Encounter (Signed)
FYI

## 2019-11-26 NOTE — Telephone Encounter (Signed)
He called in c/o passing bright red blood in his stools since Christmas morning.   He noticed his stools were black on either Monday or Tuesday of last week.  He had a stool this morning with bright red blood in it and it was red yesterday too. Denies abd pain or diarrhea/constipation.  He lives in [place], [place].  "I used to be his neighbor".   "I still get my medical care from him".    When I asked how far a drive it was from [place] to [place] he said 2 hours.    I let him know he needed to go to the local hospital in [place].   He was agreeable to this.   It's [facility] he is going to now.  I sent my triage notes to Dr. Alben Spittle office so he would be aware of the ED referral.    Reason for Disposition . SEVERE rectal bleeding (large blood clots; on and off, or constant bleeding)  Answer Assessment - Initial Assessment Questions 1. APPEARANCE of BLOOD: "What color is it?" "Is it passed separately, on the surface of the stool, or mixed in with the stool?"      I'm having bloody stools.   Christmas bright red.   Then yesterday it was bright red.    2. AMOUNT: "How much blood was passed?"      This morning it's bright red on the stool.    3. FREQUENCY: "How many times has blood been passed with the stools?"      One this morning.    Yesterday one that had a lot of blood. 4. ONSET: "When was the blood first seen in the stools?" (Days or weeks)      Last Mon. Or Tues. I noticed my stool was black.  Friday is when I had the first red stool. 5. DIARRHEA: "Is there also some diarrhea?" If so, ask: "How many diarrhea stools were passed in past 24 hours?"      It's a formed stool but with blood in it. 6. CONSTIPATION: "Do you have constipation?" If so, "How bad is it?"     No 7. RECURRENT SYMPTOMS: "Have you had blood in your stools before?" If so, ask: "When was the last time?" and "What happened that time?"      No   No history of bowel problems. 8. BLOOD THINNERS: "Do you  take any blood thinners?" (e.g., Coumadin/warfarin, Pradaxa/dabigatran, aspirin)     Baby aspirin daily 81 mg 9. OTHER SYMPTOMS: "Do you have any other symptoms?"  (e.g., abdominal pain, vomiting, dizziness, fever)     No abd pain.   Every time I eat I have reflux/indigestion.    I've always had that it's not new. The top part of my rear end/crack I have pain.  It's a pain there all the time.  It's consistant.    It's worse when I drive a lot.  I had surgery in 2000 on my back so it's not unusual for me to have discomfort there. 10. PREGNANCY: "Is there any chance you are pregnant?" "When was your last menstrual period?"       N/A  Protocols used: RECTAL BLEEDING-A-AH

## 2019-11-27 MED ORDER — PANTOPRAZOLE SODIUM 40 MG IV SOLR
40.00 | INTRAVENOUS | Status: DC
Start: 2019-11-27 — End: 2019-11-27

## 2019-12-10 ENCOUNTER — Telehealth: Payer: Self-pay

## 2019-12-10 NOTE — Telephone Encounter (Signed)
Copied from CRM [id number]. Topic: General - Other >> Dec 10, 2019  9:37 AM Angela Nevin wrote: Patient calling to request orders in system for hosp follow up. Patient states he would like to have labwork done before appointment on Thursday.

## 2019-12-10 NOTE — Telephone Encounter (Signed)
Please advise? There is an ED note from [facility] on 11/26/2019 in chart, but no mention of patient needing further labwork.

## 2019-12-12 ENCOUNTER — Telehealth: Payer: Self-pay | Admitting: Family Medicine

## 2019-12-12 NOTE — Telephone Encounter (Signed)
Please review

## 2019-12-12 NOTE — Telephone Encounter (Signed)
Patient had a f/u hospital visit scheduled for 12/13/19 with Dr. Sullivan Lone.   I called to reschedule appt, the patient was very upset that no one had called him back from 12/10/19 to have labwork done.   I tried to explain that Dr. Sullivan Lone was out of the office due to illness and that another provider would be handling his message but he wasn't happy with that answer either and questioned me as to why I waited till today to reschedule his appt since it was for tomorrow. He went on to say "he drives from [place] to see the doc and this is not acceptable and if we couldn't do his labs, he'd find someone who could".   I told him I was sorry we couldn't meet his demand at the time he called as we had providers out with illness. He said thank you and hung up.

## 2019-12-13 ENCOUNTER — Inpatient Hospital Stay: Payer: Private Health Insurance - Indemnity | Admitting: Family Medicine

## 2019-12-13 ENCOUNTER — Other Ambulatory Visit: Payer: Self-pay

## 2019-12-13 DIAGNOSIS — E785 Hyperlipidemia, unspecified: Secondary | ICD-10-CM

## 2019-12-13 DIAGNOSIS — D649 Anemia, unspecified: Secondary | ICD-10-CM

## 2019-12-13 DIAGNOSIS — E1169 Type 2 diabetes mellitus with other specified complication: Secondary | ICD-10-CM

## 2019-12-13 DIAGNOSIS — K921 Melena: Secondary | ICD-10-CM

## 2019-12-13 DIAGNOSIS — K922 Gastrointestinal hemorrhage, unspecified: Secondary | ICD-10-CM

## 2019-12-14 ENCOUNTER — Telehealth: Payer: Self-pay

## 2019-12-14 LAB — CBC WITH DIFFERENTIAL/PLATELET
Basophils Absolute: 0.1 10*3/uL (ref 0.0–0.2)
Basos: 1 %
EOS (ABSOLUTE): 0.2 10*3/uL (ref 0.0–0.4)
Eos: 3 %
Hematocrit: 45.4 % (ref 37.5–51.0)
Hemoglobin: 15.3 g/dL (ref 13.0–17.7)
Immature Grans (Abs): 0 10*3/uL (ref 0.0–0.1)
Immature Granulocytes: 1 %
Lymphocytes Absolute: 1.4 10*3/uL (ref 0.7–3.1)
Lymphs: 29 %
MCH: 32.2 pg (ref 26.6–33.0)
MCHC: 33.7 g/dL (ref 31.5–35.7)
MCV: 96 fL (ref 79–97)
Monocytes Absolute: 0.5 10*3/uL (ref 0.1–0.9)
Monocytes: 10 %
Neutrophils Absolute: 2.7 10*3/uL (ref 1.4–7.0)
Neutrophils: 56 %
Platelets: 291 10*3/uL (ref 150–450)
RBC: 4.75 x10E6/uL (ref 4.14–5.80)
RDW: 12.6 % (ref 11.6–15.4)
WBC: 4.8 10*3/uL (ref 3.4–10.8)

## 2019-12-14 LAB — COMPREHENSIVE METABOLIC PANEL
ALT: 23 IU/L (ref 0–44)
AST: 18 IU/L (ref 0–40)
Albumin/Globulin Ratio: 1.9 (ref 1.2–2.2)
Albumin: 4.1 g/dL (ref 3.8–4.8)
Alkaline Phosphatase: 57 IU/L (ref 39–117)
BUN/Creatinine Ratio: 15 (ref 10–24)
BUN: 14 mg/dL (ref 8–27)
Bilirubin Total: 0.5 mg/dL (ref 0.0–1.2)
CO2: 23 mmol/L (ref 20–29)
Calcium: 9.7 mg/dL (ref 8.6–10.2)
Chloride: 103 mmol/L (ref 96–106)
Creatinine, Ser: 0.94 mg/dL (ref 0.76–1.27)
GFR calc Af Amer: 95 mL/min/{1.73_m2} (ref >59)
GFR calc non Af Amer: 82 mL/min/{1.73_m2} (ref >59)
Globulin, Total: 2.2 g/dL (ref 1.5–4.5)
Glucose: 91 mg/dL (ref 65–99)
Potassium: 4.7 mmol/L (ref 3.5–5.2)
Sodium: 141 mmol/L (ref 134–144)
Total Protein: 6.3 g/dL (ref 6.0–8.5)

## 2019-12-14 LAB — TSH: TSH: 2.45 u[IU]/mL (ref 0.450–4.500)

## 2019-12-14 LAB — LIPID PANEL WITH LDL/HDL RATIO
Cholesterol, Total: 177 mg/dL (ref 100–199)
HDL: 55 mg/dL (ref >39)
LDL Chol Calc (NIH): 113 mg/dL — ABNORMAL HIGH (ref 0–99)
LDL/HDL Ratio: 2.1 ratio (ref 0.0–3.6)
Triglycerides: 47 mg/dL (ref 0–149)
VLDL Cholesterol Cal: 9 mg/dL (ref 5–40)

## 2019-12-14 NOTE — Telephone Encounter (Signed)
Spoke with patient and informed him of his labs in normal range. He gave verbal understanding.

## 2019-12-20 ENCOUNTER — Ambulatory Visit: Payer: Private Health Insurance - Indemnity | Attending: Internal Medicine

## 2019-12-20 DIAGNOSIS — Z23 Encounter for immunization: Secondary | ICD-10-CM | POA: Insufficient documentation

## 2019-12-20 NOTE — Progress Notes (Signed)
   Covid-19 Vaccination Clinic  Name:  Ryan Castro    MRN: [id number] DOB: 1951/06/03  12/20/2019  Ryan Castro was observed post Covid-19 immunization for 15 minutes without incidence. He was provided with Vaccine Information Sheet and instruction to access the V-Safe system.   Ryan Castro was instructed to call 911 with any severe reactions post vaccine: Marland Kitchen Difficulty breathing  . Swelling of your face and throat  . A fast heartbeat  . A bad rash all over your body  . Dizziness and weakness    Immunizations Administered    Name Date Dose VIS Date Route   Pfizer COVID-19 Vaccine 12/20/2019  2:18 PM 0.3 mL 11/09/2019 Intramuscular   Manufacturer: [employer], [employer]   Lot: [id number]   NDC: [address]-9

## 2020-01-10 ENCOUNTER — Ambulatory Visit: Payer: Private Health Insurance - Indemnity | Attending: Internal Medicine

## 2020-01-10 DIAGNOSIS — Z23 Encounter for immunization: Secondary | ICD-10-CM | POA: Insufficient documentation

## 2020-01-10 NOTE — Progress Notes (Signed)
   Covid-19 Vaccination Clinic  Name:  Ryan Castro    MRN: [id number] DOB: 1951-01-31  01/10/2020  Ryan Castro was observed post Covid-19 immunization for 15 minutes without incidence. He was provided with Vaccine Information Sheet and instruction to access the V-Safe system.   Ryan Castro was instructed to call 911 with any severe reactions post vaccine: Marland Kitchen Difficulty breathing  . Swelling of your face and throat  . A fast heartbeat  . A bad rash all over your body  . Dizziness and weakness    Immunizations Administered    Name Date Dose VIS Date Route   Pfizer COVID-19 Vaccine 01/10/2020  3:26 PM 0.3 mL 11/09/2019 Intramuscular   Manufacturer: [employer], [employer]   Lot: [id number]   NDC: [address]-7

## 2020-09-01 ENCOUNTER — Ambulatory Visit: Payer: Self-pay

## 2020-09-01 NOTE — Telephone Encounter (Signed)
Pt. Reports he fell 1 week ago at home in his driveway. Hit the back of his head. No laceration or swelling. Has a headache since then. 3-4/10 on pain scale.Has neck pain as well. Instructed to go to ED as per [facility] in the practice. Pt. States "I'm still a patient of his and I'd like to know what he wants me to do. He'll probably order an MRI." Please advise pt. Reason for Disposition . Sounds like a serious injury to the triager  Answer Assessment - Initial Assessment Questions 1. MECHANISM: "How did the injury happen?" For falls, ask: "What height did you fall from?" and "What surface did you fall against?"      Fell in driveway 1 week a go. 2. ONSET: "When did the injury happen?" (Minutes or hours ago)      1 week ago. 3. NEUROLOGIC SYMPTOMS: "Was there any loss of consciousness?" "Are there any other neurological symptoms?"      No 4. MENTAL STATUS: "Does the person know who he is, who you are, and where he is?"      Alert 5. LOCATION: "What part of the head was hit?"      Back of head. 6. SCALP APPEARANCE: "What does the scalp look like? Is it bleeding now?" If Yes, ask: "Is it difficult to stop?"      No bleeding. 7. SIZE: For cuts, bruises, or swelling, ask: "How large is it?" (e.g., inches or centimeters)      None 8. PAIN: "Is there any pain?" If Yes, ask: "How bad is it?"  (e.g., Scale 1-10; or mild, moderate, severe)     3-4 9. TETANUS: For any breaks in the skin, ask: "When was the last tetanus booster?"     Unsure 10. OTHER SYMPTOMS: "Do you have any other symptoms?" (e.g., neck pain, vomiting)       Neck pain 11. PREGNANCY: "Is there any chance you are pregnant?" "When was your last menstrual period?"       N/A  Protocols used: HEAD INJURY-A-AH

## 2020-09-01 NOTE — Telephone Encounter (Signed)
I spoke with patient and advised ED with his situation.  He is agreeable and will follow up there.  No other action or calls from here needed at present

## 2020-12-30 ENCOUNTER — Encounter: Payer: Self-pay | Admitting: Family Medicine

## 2020-12-30 ENCOUNTER — Ambulatory Visit (INDEPENDENT_AMBULATORY_CARE_PROVIDER_SITE_OTHER): Payer: 59 | Admitting: Family Medicine

## 2020-12-30 ENCOUNTER — Other Ambulatory Visit: Payer: Self-pay

## 2020-12-30 VITALS — BP 127/82 | HR 83 | Temp 98.4°F | Resp 16 | Ht 68.0 in | Wt 201.0 lb

## 2020-12-30 DIAGNOSIS — E7800 Pure hypercholesterolemia, unspecified: Secondary | ICD-10-CM | POA: Diagnosis not present

## 2020-12-30 DIAGNOSIS — R42 Dizziness and giddiness: Secondary | ICD-10-CM

## 2020-12-30 DIAGNOSIS — H9319 Tinnitus, unspecified ear: Secondary | ICD-10-CM | POA: Diagnosis not present

## 2020-12-30 DIAGNOSIS — R200 Anesthesia of skin: Secondary | ICD-10-CM

## 2020-12-30 NOTE — Progress Notes (Signed)
I,April Miller,acting as a scribe for Megan Mans, MD.,have documented all relevant documentation on the behalf of Megan Mans, MD,as directed by  Megan Mans, MD while in the presence of Megan Mans, MD.   Established patient visit   Patient: Ryan Castro   DOB: January 30, 1951   70 y.o. Male  MRN: [id number] Visit Date: 12/30/2020  Today's healthcare provider: Megan Mans, MD   Chief Complaint  Patient presents with  . Numbness    Lower and jaw   Subjective    HPI  Patient comes in today for about a week he has had numbness and tingling to his lower lip and chin.  No problems with drinking or swallowing.  No drooling.  No weakness and no problems with speech He does have some ongoing dizziness and at times he feels as though he will fall backwards. He also has chronic tinnitus that seems to be on both sides. No chest pain syncope or presyncope. HPI    Numbness     Additional comments: Lower and jaw       Last edited by Marlene Lard, CMA on 12/30/2020  9:32 AM. (History)      Patient is here concerning numbness in his lower lip and jaw. Patient states he has had numbness for around 2 weeks. Patient states his tinnitus has worsened. Patient states he is also off balance. Patient states that he had a fall 5 months ago, where he fell backwards off the steps. Patient states he hit the back of his head hard on the asphalt.  His wife was present when he fell, no loss of consciousness at the time.      Medications: Outpatient Medications Prior to Visit  Medication Sig  . aspirin 81 MG tablet Take by mouth.  . Multiple Vitamin (MULTIVITAMIN) tablet Take 1 tablet by mouth daily.  . Multiple Vitamins-Minerals (ONE DAILY CALCIUM/IRON) TABS Take by mouth.  . Omega-3 Fatty Acids (FISH OIL) 1000 MG CAPS Take by mouth daily.  . vitamin C (ASCORBIC ACID) 500 MG tablet Take by mouth.  . diazepam (VALIUM) 2 MG tablet 1-2 every 6 hours as needed for  vertigo (Patient not taking: Reported on 12/30/2020)  . meclizine (ANTIVERT) 25 MG tablet Take 1 tablet (25 mg total) by mouth 3 (three) times daily as needed for dizziness. (Patient not taking: Reported on 12/30/2020)  . omeprazole (PRILOSEC) 40 MG capsule Take by mouth. (Patient not taking: Reported on 12/30/2020)   No facility-administered medications prior to visit.    Review of Systems  Constitutional: Negative for appetite change, chills and fever.  Respiratory: Negative for chest tightness, shortness of breath and wheezing.   Cardiovascular: Negative for chest pain and palpitations.  Gastrointestinal: Negative for abdominal pain and nausea.        Objective    BP 127/82 (BP Location: Left Arm, Patient Position: Sitting, Cuff Size: Large)   Pulse 83   Temp 98.4 F (36.9 C) (Oral)   Resp 16   Ht 5\' 8"  (1.727 m)   Wt 201 lb (91.2 kg)   SpO2 98%   BMI 30.56 kg/m  BP Readings from Last 3 Encounters:  12/30/20 127/82  12/27/18 120/84  06/15/17 110/82   Wt Readings from Last 3 Encounters:  12/30/20 201 lb (91.2 kg)  12/27/18 201 lb (91.2 kg)  06/15/17 198 lb (89.8 kg)       Physical Exam Vitals reviewed.  Constitutional:  Appearance: He is well-developed.  HENT:     Head: Normocephalic and atraumatic.     Right Ear: External ear normal.     Left Ear: External ear normal.  Eyes:     Conjunctiva/sclera: Conjunctivae normal.     Pupils: Pupils are equal, round, and reactive to light.  Neck:     Vascular: No carotid bruit.  Cardiovascular:     Rate and Rhythm: Normal rate and regular rhythm.     Heart sounds: Normal heart sounds. No murmur heard. No gallop.   Pulmonary:     Effort: Pulmonary effort is normal. No respiratory distress.     Breath sounds: Normal breath sounds. No wheezing.  Abdominal:     General: There is no distension.     Palpations: Abdomen is soft.     Tenderness: There is no abdominal tenderness.  Genitourinary:    Penis: No tenderness.       Rectum: Guaiac result negative.  Musculoskeletal:        General: No tenderness.     Cervical back: Normal range of motion and neck supple.  Skin:    General: Skin is warm and dry.     Findings: No erythema or rash.  Neurological:     General: No focal deficit present.     Mental Status: He is alert and oriented to person, place, and time.     Cranial Nerves: No cranial nerve deficit.     Sensory: No sensory deficit.     Motor: No weakness.     Coordination: Coordination normal.     Gait: Gait normal.  Psychiatric:        Mood and Affect: Mood normal.        Behavior: Behavior normal.        Thought Content: Thought content normal.        Judgment: Judgment normal.       No results found for any visits on 12/30/20.  Assessment & Plan     1. Numbness around mouth Uncertain etiology.  Normal exam today.   refer back to neurology.  2. Tinnitus, unspecified laterality Consider Mnire's disease although some of the symptoms do not fit. - Ambulatory referral to Neurology  3. Dizziness I would like to consider imaging but patient has already seen neurology in the past will refer back to Dr. Sherryll Burger for his opinion.  Patient did fall about 5 months ago striking his head.  Normal neurologic exam today. - Ambulatory referral to Neurology  4. Hypercholesterolemia Mild elevation with last LDL of 113   No follow-ups on file.      I, Megan Mans, MD, have reviewed all documentation for this visit. The documentation on 01/06/21 for the exam, diagnosis, procedures, and orders are all accurate and complete.    Calieb Lichtman Wendelyn Breslow, MD  [facility] [phone] (phone) [phone] (fax)  [facility]

## 2021-01-25 ENCOUNTER — Encounter: Payer: Self-pay | Admitting: Family Medicine

## 2021-02-09 ENCOUNTER — Other Ambulatory Visit: Payer: Self-pay | Admitting: Neurology

## 2021-06-24 ENCOUNTER — Ambulatory Visit: Payer: Self-pay | Admitting: Family Medicine

## 2021-06-25 ENCOUNTER — Other Ambulatory Visit: Payer: Self-pay | Admitting: Family Medicine

## 2021-06-25 DIAGNOSIS — U071 COVID-19: Secondary | ICD-10-CM

## 2021-06-25 MED ORDER — NIRMATRELVIR/RITONAVIR (PAXLOVID) TABLET (RENAL DOSING): 2.0000 | ORAL_TABLET | Freq: Two times a day (BID) | ORAL | 0 refills | Status: AC

## 2021-06-25 NOTE — Progress Notes (Signed)
Covid with nor labs since 2021 Renal dose paxlovid.

## 2021-10-05 ENCOUNTER — Telehealth: Payer: Self-pay

## 2021-10-05 NOTE — Telephone Encounter (Signed)
Copied from CRM [id number]. Topic: General - Other >> Oct 05, 2021  2:20 PM Jaquita Rector A wrote: Reason for CRM: Patient called in to inquire from Dr Sullivan Lone about getting a complete blood panel just to make sure he is fine. Would like a call back with an answer please at Ph#  [phone]

## 2021-10-13 NOTE — Telephone Encounter (Signed)
Patient advised as below. He reports he will wait for labs to be done at next ov.

## 2022-02-10 ENCOUNTER — Ambulatory Visit (INDEPENDENT_AMBULATORY_CARE_PROVIDER_SITE_OTHER): Payer: 59 | Admitting: Family Medicine

## 2022-02-10 ENCOUNTER — Encounter: Payer: Self-pay | Admitting: Family Medicine

## 2022-02-10 ENCOUNTER — Other Ambulatory Visit: Payer: Self-pay

## 2022-02-10 VITALS — BP 133/85 | HR 69 | Temp 98.3°F | Resp 16 | Ht 68.0 in | Wt 197.0 lb

## 2022-02-10 DIAGNOSIS — E663 Overweight: Secondary | ICD-10-CM

## 2022-02-10 DIAGNOSIS — K2100 Gastro-esophageal reflux disease with esophagitis, without bleeding: Secondary | ICD-10-CM | POA: Diagnosis not present

## 2022-02-10 DIAGNOSIS — K922 Gastrointestinal hemorrhage, unspecified: Secondary | ICD-10-CM

## 2022-02-10 DIAGNOSIS — R079 Chest pain, unspecified: Secondary | ICD-10-CM

## 2022-02-10 DIAGNOSIS — E7800 Pure hypercholesterolemia, unspecified: Secondary | ICD-10-CM

## 2022-02-10 DIAGNOSIS — R0609 Other forms of dyspnea: Secondary | ICD-10-CM

## 2022-02-10 DIAGNOSIS — Z0000 Encounter for general adult medical examination without abnormal findings: Secondary | ICD-10-CM | POA: Diagnosis not present

## 2022-02-10 DIAGNOSIS — Z8719 Personal history of other diseases of the digestive system: Secondary | ICD-10-CM

## 2022-02-10 NOTE — Progress Notes (Signed)
? ? ? ?Annual Wellness Visit ? ?  ?I,April Miller,acting as a scribe for Megan Mans, MD.,have documented all relevant documentation on the behalf of Megan Mans, MD,as directed by  Megan Mans, MD while in the presence of Megan Mans, MD. ? ? ?Patient: Ryan Castro, Male    DOB: Jan 10, 1951, 71 y.o.   MRN: [id number] ?Visit Date: 02/10/2022 ? ?Today's Provider: Megan Mans, MD  ? ?Chief Complaint  ?Patient presents with  ? Medicare Wellness  ? ?Subjective  ?  ?Ryan Castro is a 71 y.o. male who presents today for his Annual Wellness Visit. ?He reports consuming a general diet. The patient does not participate in regular exercise at present. He generally feels well. He reports sleeping fairly well. He does have additional problems to discuss today.  ?He had a recurrent low lower GI bleed 2 weeks ago that lasted for 3 days with blood in the toilet.  Since then he has had DOE and epigastric pain with exertion.  No chest pain, PND orthopnea diaphoresis or nausea. ?HPI ? ?He and his wife moved into their new house at [facility] next week. ? ?Medications: ?Outpatient Medications Prior to Visit  ?Medication Sig  ? aspirin 81 MG tablet Take by mouth.  ? Multiple Vitamin (MULTIVITAMIN) tablet Take 1 tablet by mouth daily.  ? vitamin C (ASCORBIC ACID) 500 MG tablet Take by mouth.  ? [DISCONTINUED] Multiple Vitamins-Minerals (ONE DAILY CALCIUM/IRON) TABS Take by mouth.  ? [DISCONTINUED] diazepam (VALIUM) 2 MG tablet 1-2 every 6 hours as needed for vertigo (Patient not taking: Reported on 12/30/2020)  ? [DISCONTINUED] meclizine (ANTIVERT) 25 MG tablet Take 1 tablet (25 mg total) by mouth 3 (three) times daily as needed for dizziness. (Patient not taking: Reported on 12/30/2020)  ? [DISCONTINUED] Omega-3 Fatty Acids (FISH OIL) 1000 MG CAPS Take by mouth daily.  ? [DISCONTINUED] omeprazole (PRILOSEC) 40 MG capsule Take by mouth. (Patient not taking: Reported on 12/30/2020)  ? ?No  facility-administered medications prior to visit.  ?  ?No Known Allergies ? ?Patient Care Team: ?Maple Hudson., MD as PCP - General (Family Medicine) ? ?Review of Systems  ?Eyes:  Positive for redness.  ?Respiratory:  Positive for chest tightness.   ?Cardiovascular:  Positive for chest pain.  ?Gastrointestinal:  Positive for blood in stool.  ?Musculoskeletal:  Positive for arthralgias.  ?Neurological:  Positive for dizziness.  ?Hematological:  Bruises/bleeds easily.  ?All other systems reviewed and are negative. ? ?Last CBC ?Lab Results  ?Component Value Date  ? WBC 6.5 02/10/2022  ? HGB 10.3 (L) 02/10/2022  ? HCT 32.7 (L) 02/10/2022  ? MCV 92 02/10/2022  ? MCH 28.9 02/10/2022  ? RDW 15.8 (H) 02/10/2022  ? PLT 363 02/10/2022  ? ?  ?  ? Objective  ?  ?Vitals: BP 133/85 (BP Location: Left Arm, Patient Position: Sitting, Cuff Size: Large)   Pulse 69   Temp 98.3 ?F (36.8 ?C) (Temporal)   Resp 16   Ht 5\' 8"  (1.727 m)   Wt 197 lb (89.4 kg)   SpO2 99%   BMI 29.95 kg/m?  ?BP Readings from Last 3 Encounters:  ?02/10/22 133/85  ?12/30/20 127/82  ?12/27/18 120/84  ? ?Wt Readings from Last 3 Encounters:  ?02/10/22 197 lb (89.4 kg)  ?12/30/20 201 lb (91.2 kg)  ?12/27/18 201 lb (91.2 kg)  ? ?  ? ? ?Physical Exam ?Vitals reviewed.  ?Constitutional:   ?   Appearance: He is  well-developed.  ?HENT:  ?   Head: Normocephalic and atraumatic.  ?   Right Ear: External ear normal.  ?   Left Ear: External ear normal.  ?Eyes:  ?   Conjunctiva/sclera: Conjunctivae normal.  ?   Pupils: Pupils are equal, round, and reactive to light.  ?Cardiovascular:  ?   Rate and Rhythm: Normal rate and regular rhythm.  ?   Heart sounds: Normal heart sounds. No murmur heard. ?Pulmonary:  ?   Effort: Pulmonary effort is normal. No respiratory distress.  ?   Breath sounds: Normal breath sounds. No wheezing.  ?Abdominal:  ?   General: There is no distension.  ?   Palpations: Abdomen is soft.  ?   Tenderness: There is no abdominal tenderness.   ?Genitourinary: ?   Penis: Normal. No tenderness.   ?   Testes: Normal.  ?   Rectum: Guaiac result negative.  ?Musculoskeletal:     ?   General: No tenderness.  ?   Cervical back: Normal range of motion and neck supple.  ?Skin: ?   General: Skin is warm and dry.  ?   Findings: No erythema.  ?Neurological:  ?   Mental Status: He is alert and oriented to person, place, and time.  ?   Cranial Nerves: No cranial nerve deficit.  ?   Motor: No abnormal muscle tone.  ?   Coordination: Coordination normal.  ?   Deep Tendon Reflexes: Reflexes normal.  ?Psychiatric:     ?   Behavior: Behavior normal.     ?   Thought Content: Thought content normal.     ?   Judgment: Judgment normal.  ?ECG reveals sinus rhythm rate of about 64 without ischemic changes ? ? ?Most recent functional status assessment: ?In your present state of health, do you have any difficulty performing the following activities: 02/10/2022  ?Hearing? N  ?Vision? N  ?Difficulty concentrating or making decisions? N  ?Walking or climbing stairs? Y  ?Dressing or bathing? N  ?Doing errands, shopping? N  ?Some recent data might be hidden  ? ?Most recent fall risk assessment: ?Fall Risk  02/10/2022  ?Falls in the past year? 1  ?Number falls in past yr: 1  ?Injury with Fall? 1  ?Comment -  ?Risk for fall due to : History of fall(s)  ?Follow up Falls evaluation completed  ? ? Most recent depression screenings: ?PHQ 2/9 Scores 02/10/2022 06/15/2017  ?PHQ - 2 Score 0 0  ?PHQ- 9 Score 1 0  ? ?Most recent cognitive screening: ?No flowsheet data found. ?Most recent Audit-C alcohol use screening ?Alcohol Use Disorder Test (AUDIT) 02/10/2022  ?1. How often do you have a drink containing alcohol? 1  ?2. How many drinks containing alcohol do you have on a typical day when you are drinking? 0  ?3. How often do you have six or more drinks on one occasion? 0  ?AUDIT-C Score 1  ? ?A score of 3 or more in women, and 4 or more in men indicates increased risk for alcohol abuse, EXCEPT if  all of the points are from question 1  ? ?No results found for any visits on 02/10/22. ? Assessment & Plan  ?  ? ?Annual wellness visit done today including the all of the following: ?Reviewed patient's Family Medical History ?Reviewed and updated list of patient's medical providers ?Assessment of cognitive impairment was done ?Assessed patient's functional ability ?Established a written schedule for health screening services ?Health Risk Assessent Completed and  Reviewed ? ?Exercise Activities and Dietary recommendations ? Goals   ?None ?  ? ? ?Immunization History  ?Administered Date(s) Administered  ? Influenza, High Dose Seasonal PF 09/27/2016  ? Influenza-Unspecified 10/12/2021  ? PFIZER(Purple Top)SARS-COV-2 Vaccination 12/20/2019, 01/10/2020  ? Pneumococcal Conjugate-13 04/13/2016  ? Pneumococcal Polysaccharide-23 06/15/2017  ? Tdap 02/21/2008  ? Zoster Recombinat (Shingrix) 10/03/2018, 01/17/2019  ? Zoster, Live 11/22/2011  ? ? ?Health Maintenance  ?Topic Date Due  ? URINE MICROALBUMIN  Never done  ? TETANUS/TDAP  02/20/2018  ? COLONOSCOPY (Pts 45-59yrs Insurance coverage will need to be confirmed)  12/05/2018  ? Pneumonia Vaccine 30+ Years old  Completed  ? INFLUENZA VACCINE  Completed  ? COVID-19 Vaccine  Completed  ? Hepatitis C Screening  Completed  ? Zoster Vaccines- Shingrix  Completed  ? HPV VACCINES  Aged Out  ? ? ? ?Discussed health benefits of physical activity, and encouraged him to engage in regular exercise appropriate for his age and condition.  ?  ? ?1. Encounter for Medicare annual wellness exam ? ?- CBC w/Diff/Platelet ?- Lipid panel ?- TSH ?- CBC w/Diff/Platelet ?- Comprehensive Metabolic Panel (CMET) ? ?2. Annual physical exam ?[place]. ? ?3. Hypercholesterolemia ? ?- CBC w/Diff/Platelet ?- Lipid panel ?- TSH ?- CBC w/Diff/Platelet ?- Comprehensive Metabolic Panel (CMET) ? ?4. Gastroesophageal reflux disease with  esophagitis, unspecified whether hemorrhage ? ?- CBC w/Diff/Platelet ?- Lipid panel ?- TSH ?- CBC w/Diff/Platelet ?- Comprehensive Metabolic Panel (CMET) ? ?5. Overweight ? ?- CBC w/Diff/Platelet ?- Lipid panel ?- TSH ?

## 2022-02-11 LAB — COMPREHENSIVE METABOLIC PANEL
ALT: 26 IU/L (ref 0–44)
AST: 31 IU/L (ref 0–40)
Albumin/Globulin Ratio: 2 (ref 1.2–2.2)
Albumin: 4.4 g/dL (ref 3.7–4.7)
Alkaline Phosphatase: 60 IU/L (ref 44–121)
BUN/Creatinine Ratio: 20 (ref 10–24)
BUN: 15 mg/dL (ref 8–27)
Bilirubin Total: 0.8 mg/dL (ref 0.0–1.2)
CO2: 26 mmol/L (ref 20–29)
Calcium: 9.4 mg/dL (ref 8.6–10.2)
Chloride: 100 mmol/L (ref 96–106)
Creatinine, Ser: 0.74 mg/dL — ABNORMAL LOW (ref 0.76–1.27)
Globulin, Total: 2.2 g/dL (ref 1.5–4.5)
Glucose: 81 mg/dL (ref 70–99)
Potassium: 4.2 mmol/L (ref 3.5–5.2)
Sodium: 139 mmol/L (ref 134–144)
Total Protein: 6.6 g/dL (ref 6.0–8.5)
eGFR: 97 mL/min/{1.73_m2} (ref >59)

## 2022-02-11 LAB — CBC WITH DIFFERENTIAL/PLATELET
Basophils Absolute: 0.1 10*3/uL (ref 0.0–0.2)
Basos: 1 %
EOS (ABSOLUTE): 0.1 10*3/uL (ref 0.0–0.4)
Eos: 2 %
Hematocrit: 32.7 % — ABNORMAL LOW (ref 37.5–51.0)
Hemoglobin: 10.3 g/dL — ABNORMAL LOW (ref 13.0–17.7)
Immature Grans (Abs): 0 10*3/uL (ref 0.0–0.1)
Immature Granulocytes: 1 %
Lymphocytes Absolute: 1.5 10*3/uL (ref 0.7–3.1)
Lymphs: 23 %
MCH: 28.9 pg (ref 26.6–33.0)
MCHC: 31.5 g/dL (ref 31.5–35.7)
MCV: 92 fL (ref 79–97)
Monocytes Absolute: 0.7 10*3/uL (ref 0.1–0.9)
Monocytes: 10 %
Neutrophils Absolute: 4.1 10*3/uL (ref 1.4–7.0)
Neutrophils: 63 %
Platelets: 363 10*3/uL (ref 150–450)
RBC: 3.56 x10E6/uL — ABNORMAL LOW (ref 4.14–5.80)
RDW: 15.8 % — ABNORMAL HIGH (ref 11.6–15.4)
WBC: 6.5 10*3/uL (ref 3.4–10.8)

## 2022-02-11 LAB — LIPID PANEL
Chol/HDL Ratio: 1.9 ratio (ref 0.0–5.0)
Cholesterol, Total: 187 mg/dL (ref 100–199)
HDL: 98 mg/dL (ref >39)
LDL Chol Calc (NIH): 81 mg/dL (ref 0–99)
Triglycerides: 35 mg/dL (ref 0–149)
VLDL Cholesterol Cal: 8 mg/dL (ref 5–40)

## 2022-02-11 LAB — TSH: TSH: 2.36 u[IU]/mL (ref 0.450–4.500)

## 2022-02-15 ENCOUNTER — Telehealth: Payer: Self-pay

## 2022-02-15 NOTE — Telephone Encounter (Signed)
Copied from CRM [id number]. Topic: Referral - Request for Referral ?>> Feb 15, 2022  2:37 PM Ryan Castro wrote: ?Has patient seen PCP for this complaint? Yes.   ?*If NO, is insurance requiring patient see PCP for this issue before PCP can refer them? ?Referral for which specialty: Cardiology  ?Preferred provider/office: Ryan Castro - [facility] ?Reason for referral: patient has had cardio concerns ?

## 2022-02-16 ENCOUNTER — Other Ambulatory Visit: Payer: Self-pay | Admitting: *Deleted

## 2022-02-16 DIAGNOSIS — R0609 Other forms of dyspnea: Secondary | ICD-10-CM

## 2022-02-16 NOTE — Telephone Encounter (Signed)
Referral ordered

## 2022-02-16 NOTE — Telephone Encounter (Signed)
Please make this referral.  [place] referral for recent chest pain and dyspnea on exertion.

## 2022-12-23 ENCOUNTER — Ambulatory Visit: Payer: Self-pay | Admitting: *Deleted

## 2022-12-23 NOTE — Telephone Encounter (Signed)
Summary: Advice Colonoscopy   Pt is calling to ask when was his last colonoscopy? Please advise         Chief Complaint: Question Symptoms: Asking when last colonoscopy was, noted 11/2018 did both endo and colonoscopy. Pt mentioned mid stomach pain at ribs, vomited x 1 weeks ago, dark. Has been nauseated. IS no longer pt a [facility],moved away. Frequency: 2 weeks Pertinent Negatives: Patient denies blood in stools, change in stolls. Disposition: [] ED /[] Urgent Care (no appt availability in office) / [] Appointment(In office/virtual)/ []  [facility]/ [] Home Care/ [] Refused Recommended Disposition /[] [facility]/ [x]  Follow-up with PCP Additional Notes: Advised to establish care at his new location. Advised ED for worsening symptoms. Advised ED if vomiting reoccurs. Pt verbalizes understanding.   Answer Assessment - Initial Assessment Questions 1. REASON FOR CALL or QUESTION: "What is your reason for calling today?" or "How can I best help you?" or "What question do you have that I can help answer?"     Pt questioning when last Colonoscopy  Protocols used: Information Only Call - No Triage-A-AH

## 2023-02-14 ENCOUNTER — Encounter: Payer: 59 | Admitting: Family Medicine

## 2023-02-15 ENCOUNTER — Encounter: Payer: 59 | Admitting: Family Medicine
